# Patient Record
Sex: Female | Born: 1995 | Race: Black or African American | Hispanic: No | Marital: Single | Smoking: Former smoker
Health system: Southern US, Community
[De-identification: ages and names within clinical notes are randomized; demographics above are authoritative.]

## PROBLEM LIST (undated history)

## (undated) DIAGNOSIS — E049 Nontoxic goiter, unspecified: Secondary | ICD-10-CM

## (undated) DIAGNOSIS — E669 Obesity, unspecified: Secondary | ICD-10-CM

## (undated) HISTORY — DX: Nontoxic goiter, unspecified: E04.9

## (undated) HISTORY — DX: Obesity, unspecified: E66.9

---
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
## 2013-04-22 ENCOUNTER — Encounter (HOSPITAL_COMMUNITY): Payer: Self-pay | Admitting: Emergency Medicine

## 2013-04-22 ENCOUNTER — Emergency Department (HOSPITAL_COMMUNITY)
Admission: EM | Admit: 2013-04-22 | Discharge: 2013-04-22 | Disposition: A | Discharge status: Home / Self Care | Payer: BC Managed Care – PPO | Attending: Emergency Medicine | Admitting: Emergency Medicine

## 2013-04-22 DIAGNOSIS — Y92410 Unspecified street and highway as the place of occurrence of the external cause: Secondary | ICD-10-CM | POA: Insufficient documentation

## 2013-04-22 DIAGNOSIS — Z87891 Personal history of nicotine dependence: Secondary | ICD-10-CM | POA: Insufficient documentation

## 2013-04-22 DIAGNOSIS — S4980XA Other specified injuries of shoulder and upper arm, unspecified arm, initial encounter: Secondary | ICD-10-CM | POA: Insufficient documentation

## 2013-04-22 DIAGNOSIS — J45909 Unspecified asthma, uncomplicated: Secondary | ICD-10-CM | POA: Insufficient documentation

## 2013-04-22 DIAGNOSIS — S0993XA Unspecified injury of face, initial encounter: Secondary | ICD-10-CM | POA: Insufficient documentation

## 2013-04-22 DIAGNOSIS — Y9389 Activity, other specified: Secondary | ICD-10-CM | POA: Insufficient documentation

## 2013-04-22 DIAGNOSIS — Z91040 Latex allergy status: Secondary | ICD-10-CM | POA: Insufficient documentation

## 2013-04-22 DIAGNOSIS — S46909A Unspecified injury of unspecified muscle, fascia and tendon at shoulder and upper arm level, unspecified arm, initial encounter: Secondary | ICD-10-CM | POA: Insufficient documentation

## 2013-04-22 DIAGNOSIS — M542 Cervicalgia: Secondary | ICD-10-CM

## 2013-04-22 DIAGNOSIS — M549 Dorsalgia, unspecified: Secondary | ICD-10-CM

## 2013-04-22 DIAGNOSIS — E669 Obesity, unspecified: Secondary | ICD-10-CM | POA: Insufficient documentation

## 2013-04-22 DIAGNOSIS — S199XXA Unspecified injury of neck, initial encounter: Principal | ICD-10-CM

## 2013-04-22 DIAGNOSIS — IMO0002 Reserved for concepts with insufficient information to code with codable children: Secondary | ICD-10-CM | POA: Insufficient documentation

## 2013-04-22 DIAGNOSIS — Z79899 Other long term (current) drug therapy: Secondary | ICD-10-CM | POA: Insufficient documentation

## 2013-04-22 DIAGNOSIS — M79602 Pain in left arm: Secondary | ICD-10-CM

## 2013-04-22 MED ORDER — IBUPROFEN 600 MG PO TABS: 600.0000 mg | ORAL_TABLET | Freq: Four times a day (QID) | ORAL | Status: DC | PRN

## 2013-04-22 MED ORDER — CYCLOBENZAPRINE HCL 10 MG PO TABS
10.0000 mg | ORAL_TABLET | Freq: Once | ORAL | Status: AC
  Administered 2013-04-22: 10 mg via ORAL
  Filled 2013-04-22: qty 1

## 2013-04-22 MED ORDER — IBUPROFEN 400 MG PO TABS
800.0000 mg | ORAL_TABLET | Freq: Once | ORAL | Status: AC
  Administered 2013-04-22: 800 mg via ORAL
  Filled 2013-04-22: qty 2

## 2013-04-22 MED ORDER — CYCLOBENZAPRINE HCL 10 MG PO TABS: 10.0000 mg | ORAL_TABLET | Freq: Two times a day (BID) | ORAL | Status: DC | PRN
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
## 2013-04-22 NOTE — ED Notes (Signed)
PA at bedside.
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
## 2013-04-23 NOTE — ED Provider Notes (Signed)
Medical screening examination/treatment/procedure(s) were performed by non-physician practitioner and as supervising physician I was immediately available for consultation/collaboration.  EKG Interpretation   None        Kloi Brodman F Manish Ruggiero, MD 04/23/13 0943 

## 2013-07-04 ENCOUNTER — Other Ambulatory Visit: Payer: Self-pay | Admitting: Emergency Medicine

## 2013-07-11 ENCOUNTER — Encounter: Payer: Self-pay | Admitting: Family Medicine

## 2013-07-11 ENCOUNTER — Ambulatory Visit (INDEPENDENT_AMBULATORY_CARE_PROVIDER_SITE_OTHER): Payer: BC Managed Care – PPO | Admitting: Family Medicine

## 2013-07-11 VITALS — BP 116/71 | HR 83 | Temp 98.1°F | Resp 16 | Wt 199.0 lb

## 2013-07-11 DIAGNOSIS — J309 Allergic rhinitis, unspecified: Secondary | ICD-10-CM

## 2013-07-11 DIAGNOSIS — J029 Acute pharyngitis, unspecified: Secondary | ICD-10-CM

## 2013-07-11 MED ORDER — FLUTICASONE PROPIONATE 50 MCG/ACT NA SUSP: 2.0000 | Freq: Every day | NASAL | Status: DC

## 2013-07-11 NOTE — Progress Notes (Signed)
   Subjective:    Patient ID: Angelica GuilesJasmine D Shumard, female    DOB: 1995/11/30, 18 y.o.   MRN: [id number]  HPI 18 year old female presents with two-day history of nonproductive cough, rhinorrhea, nasal congestion, and sore throat. She has had associated chills however no fevers, no shortness of breath, no chest pain, no emesis, no abdominal pain, no ear fullness, no decreased hearing, no eye itching or drainage, denies sick contacts however is around a number of children, she has attempted throat lozenges which have provided some relief, she has not taken any over-the-counter cough and cold medications   Review of Systems Please see history of present illness    Objective:   Physical Exam Vitals: Reviewed General: Pleasant African American female, no acute distress HEENT: Normocephalic, pupils are equal round and reactive to light, extra amounts are intact, no scleral icterus, right TM was obscured by cerumen, left TM was pearly-gray, patient has erythematous inflamed bilateral nasal turbinates, mild rhinorrhea present, uvula midline, 3+ tonsils bilaterally with mild erythema, no exudate, moist mucous membranes, neck was supple, no anterior or posterior cervical lymphadenopathy Cardiac: Regular in rhythm, S1 and S2 present, no murmurs, no heaves or thrills Respiratory: Clear to patient bilaterally, normal effort Abdomen: Soft, nontender, bowel sounds present       Assessment & Plan:  Please see problem specific assessment and plan

## 2013-07-11 NOTE — Patient Instructions (Signed)
Viral Infections A virus is a type of germ. Viruses can cause:  Minor sore throats.  Aches and pains.  Headaches.  Runny nose.  Rashes.  Watery eyes.  Tiredness.  Coughs.  Loss of appetite.  Feeling sick to your stomach (nausea).  Throwing up (vomiting).  Watery poop (diarrhea). HOME CARE   Only take medicines as told by your doctor.  Drink enough water and fluids to keep your pee (urine) clear or pale yellow. Sports drinks are a good choice.  Get plenty of rest and eat healthy. Soups and broths with crackers or rice are fine. GET HELP RIGHT AWAY IF:   You have a very bad headache.  You have shortness of breath.  You have chest pain or neck pain.  You have an unusual rash.  You cannot stop throwing up.  You have watery poop that does not stop.  You cannot keep fluids down.  You or your child has a temperature by mouth above 102 F (38.9 C), not controlled by medicine.  Your baby is older than 3 months with a rectal temperature of 102 F (38.9 C) or higher.  Your baby is 413 months old or younger with a rectal temperature of 100.4 F (38 C) or higher. MAKE SURE YOU:   Understand these instructions.  Will watch this condition.  Will get help right away if you are not doing well or get worse. Document Released: 01/27/2008 Document Revised: 05/08/2011 Document Reviewed: 06/21/2010 [facility] Patient Information 2014 [place], [place].   You also have allergic rhinitis (runny nose/seasonal allergies) - start Flonase

## 2013-07-11 NOTE — Assessment & Plan Note (Signed)
Patient has signs and symptoms consistent with allergic rhinitis. -Initiate therapy with Flonase

## 2013-07-11 NOTE — Assessment & Plan Note (Signed)
Patient presents for evaluation of sore throat which is consistent with viral illness. -Conservative measures discussed including salt water gargles and throat lozenges

## 2014-03-09 ENCOUNTER — Encounter (HOSPITAL_COMMUNITY): Payer: Self-pay | Admitting: Emergency Medicine

## 2014-03-09 ENCOUNTER — Emergency Department (INDEPENDENT_AMBULATORY_CARE_PROVIDER_SITE_OTHER)
Admission: EM | Admit: 2014-03-09 | Discharge: 2014-03-09 | Disposition: A | Discharge status: Home / Self Care | Payer: BLUE CROSS/BLUE SHIELD | Source: Home / Self Care | Attending: Family Medicine | Admitting: Family Medicine

## 2014-03-09 DIAGNOSIS — L739 Follicular disorder, unspecified: Secondary | ICD-10-CM

## 2014-03-09 LAB — POCT PREGNANCY, URINE: Preg Test, Ur: NEGATIVE

## 2014-03-09 MED ORDER — MUPIROCIN 2 % EX OINT: 1.0000 "application " | TOPICAL_OINTMENT | Freq: Two times a day (BID) | CUTANEOUS | Status: DC

## 2014-03-09 MED ORDER — SULFAMETHOXAZOLE-TRIMETHOPRIM 800-160 MG PO TABS: 2.0000 | ORAL_TABLET | Freq: Two times a day (BID) | ORAL | Status: DC

## 2014-03-09 NOTE — ED Provider Notes (Signed)
Angelica Griffin is a 19 y.o. female who presents to Urgent Care today for rash. Patient developed a itchy painful rash under both axilla and on abdomen starting about a week ago. She's been using the same razor and thinks maybe that's causing it. No fevers or chills nausea or vomiting. No new soaps detergents or shampoos or medications. She's tried hydrocortisone which has not helped.   Past Medical History  Diagnosis Date  . Asthma   . Obesity    History reviewed. No pertinent past surgical history. History  Substance Use Topics  . Smoking status: Former Smoker    Quit date: 02/28/2011  . Smokeless tobacco: Not on file  . Alcohol Use: No   ROS as above Medications: No current facility-administered medications for this encounter.   Current Outpatient Prescriptions  Medication Sig Dispense Refill  . PROAIR HFA 108 (90 BASE) MCG/ACT inhaler INHALE 2 PUFFS INTO THE LUNGS EVERY 6 (SIX) HOURS AS NEEDED FOR WHEEZING. 8.5 each 0  . fluticasone (FLONASE) 50 MCG/ACT nasal spray Place 2 sprays into both nostrils daily. 16 g 6  . mupirocin ointment (BACTROBAN) 2 % Place 1 application into the nose 2 (two) times daily. 22 g 1  . sulfamethoxazole-trimethoprim (SEPTRA DS) 800-160 MG per tablet Take 2 tablets by mouth 2 (two) times daily. 28 tablet 0   Allergies  Allergen Reactions  . Latex Hives  . Chlorine Rash     Exam:  BP 134/65 mmHg  Pulse 74  Temp(Src) 98.5 F (36.9 C) (Oral)  Resp 16  SpO2 100% Gen: Well NAD Skin: Folliculitis bilateral axillary area and abdomen. Minimally tender.  Results for orders placed or performed during the hospital encounter of 03/09/14 (from the past 24 hour(s))  Pregnancy, urine POC     Status: None   Collection Time: 03/09/14  7:25 PM  Result Value Ref Range   Preg Test, Ur NEGATIVE NEGATIVE   No results found.  Assessment and Plan: 19 y.o. female with folliculitis. Treat with Bactrim mupirocin nasal ointment and chlorhexidine  wash  Discussed warning signs or symptoms. Please see discharge instructions. Patient expresses understanding.     Rodolph BongEvan S Corey, MD 03/09/14 [phone]

## 2014-03-09 NOTE — Discharge Instructions (Signed)
Thank you for coming in today. Take Bactrim twice daily. Use birth control a taking this medication. Use mupirocin ointment in the nose and over-the-counter chlorhexidine wash in the shower. Return as needed   Folliculitis  Folliculitis is redness, soreness, and swelling (inflammation) of the hair follicles. This condition can occur anywhere on the body. People with weakened immune systems, diabetes, or obesity have a greater risk of getting folliculitis. CAUSES  Bacterial infection. This is the most common cause.  Fungal infection.  Viral infection.  Contact with certain chemicals, especially oils and tars. Long-term folliculitis can result from bacteria that live in the nostrils. The bacteria may trigger multiple outbreaks of folliculitis over time. SYMPTOMS Folliculitis most commonly occurs on the scalp, thighs, legs, back, buttocks, and areas where hair is shaved frequently. An early sign of folliculitis is a small, white or yellow, pus-filled, itchy lesion (pustule). These lesions appear on a red, inflamed follicle. They are usually less than 0.2 inches (5 mm) wide. When there is an infection of the follicle that goes deeper, it becomes a boil or furuncle. A group of closely packed boils creates a larger lesion (carbuncle). Carbuncles tend to occur in hairy, sweaty areas of the body. DIAGNOSIS  Your caregiver can usually tell what is wrong by doing a physical exam. A sample may be taken from one of the lesions and tested in a lab. This can help determine what is causing your folliculitis. TREATMENT  Treatment may include:  Applying warm compresses to the affected areas.  Taking antibiotic medicines orally or applying them to the skin.  Draining the lesions if they contain a large amount of pus or fluid.  Laser hair removal for cases of long-lasting folliculitis. This helps to prevent regrowth of the hair. HOME CARE INSTRUCTIONS  Apply warm compresses to the affected areas as  directed by your caregiver.  If antibiotics are prescribed, take them as directed. Finish them even if you start to feel better.  You may take over-the-counter medicines to relieve itching.  Do not shave irritated skin.  Follow up with your caregiver as directed. SEEK IMMEDIATE MEDICAL CARE IF:   You have increasing redness, swelling, or pain in the affected area.  You have a fever. MAKE SURE YOU:  Understand these instructions.  Will watch your condition.  Will get help right away if you are not doing well or get worse. Document Released: 04/24/2001 Document Revised: 08/15/2011 Document Reviewed: 05/16/2011 [facility] Patient Information 2015 [place], [place]. This information is not intended to replace advice given to you by your health care provider. Make sure you discuss any questions you have with your health care provider.

## 2014-03-09 NOTE — ED Notes (Signed)
Pt states that she has a rash under both axilla and partly on abdomen.

## 2014-05-19 ENCOUNTER — Ambulatory Visit (INDEPENDENT_AMBULATORY_CARE_PROVIDER_SITE_OTHER): Payer: BLUE CROSS/BLUE SHIELD | Admitting: Family Medicine

## 2014-05-19 ENCOUNTER — Encounter: Payer: Self-pay | Admitting: Family Medicine

## 2014-05-19 VITALS — BP 115/80 | HR 80 | Temp 98.4°F | Ht 63.0 in | Wt 212.0 lb

## 2014-05-19 DIAGNOSIS — Z23 Encounter for immunization: Secondary | ICD-10-CM

## 2014-05-19 DIAGNOSIS — G44219 Episodic tension-type headache, not intractable: Secondary | ICD-10-CM

## 2014-05-19 NOTE — Progress Notes (Signed)
  Subjective:    Angelica Griffin is a 19 y.o. female who presents for evaluation of headache. Symptoms began about 5 days ago. Generally, the headaches last about 3 hours and occur intermittently . The headaches do not seem to be related to any time of the day. The headaches are usually dull and are located in temporal region B/L.  The patient rates her most severe headaches a 7 on a scale from 1 to 10. Recently, the headaches have been stable. Work attendance or other daily activities are not affected by the headaches. Precipitating factors include: none which have been determined. The headaches are usually not preceded by an aura. Associated neurologic symptoms: none. The patient denies decreased physical activity, loss of balance, muscle weakness, vision problems and vomiting in the early morning. Home treatment has included acetaminophen and ibuprofen with some improvement. Other history includes: nothing pertinent. Family history includes migraine headaches in mother.  The following portions of the patient's history were reviewed and updated as appropriate: allergies, current medications, past family history, past medical history, past social history, past surgical history and problem list.  Review of Systems Pertinent items are noted in HPI.    Objective:    BP 115/80 mmHg  Pulse 80  Temp(Src) 98.4 F (36.9 C) (Oral)  Ht 5\' 3"  (1.6 m)  Wt 212 lb (96.163 kg)  BMI 37.56 kg/m2 General appearance: alert, cooperative and appears stated age Heart: regular rate and rhythm, S1, S2 normal, no murmur, click, rub or gallop Neurologic: Alert and oriented X 3, normal strength and tone. Normal symmetric reflexes. Normal coordination and gait Cranial nerves: normal    Assessment:    Tension-type headache, episodic    Plan:    Lie in darkened room and apply cold packs as needed for pain. Side effect profile discussed in detail. Asked to keep headache diary. Most likely tension HA in  quality for this.  Does report Hx of migraines, but has never seen a neurologist.  She has body habitus for possible Pseudtumor Cerebri but BP well controlled on no medications and denies visual Sx.  if continues, would refer to neurology for possible prophylactic dosing and abortive medications for possible migraines.  no red flags today on exam

## 2014-05-19 NOTE — Patient Instructions (Signed)

## 2014-09-29 ENCOUNTER — Encounter: Payer: Self-pay | Admitting: Family Medicine

## 2014-09-29 ENCOUNTER — Ambulatory Visit (INDEPENDENT_AMBULATORY_CARE_PROVIDER_SITE_OTHER): Payer: Self-pay | Admitting: Family Medicine

## 2014-09-29 ENCOUNTER — Other Ambulatory Visit (HOSPITAL_COMMUNITY)
Admission: RE | Admit: 2014-09-29 | Discharge: 2014-09-29 | Disposition: A | Discharge status: Home / Self Care | Payer: Self-pay | Source: Ambulatory Visit | Attending: Family Medicine | Admitting: Family Medicine

## 2014-09-29 VITALS — BP 123/66 | HR 80 | Temp 98.4°F | Ht 63.0 in | Wt 212.9 lb

## 2014-09-29 DIAGNOSIS — Z01419 Encounter for gynecological examination (general) (routine) without abnormal findings: Secondary | ICD-10-CM | POA: Insufficient documentation

## 2014-09-29 DIAGNOSIS — N898 Other specified noninflammatory disorders of vagina: Secondary | ICD-10-CM

## 2014-09-29 DIAGNOSIS — N920 Excessive and frequent menstruation with regular cycle: Secondary | ICD-10-CM

## 2014-09-29 DIAGNOSIS — N760 Acute vaginitis: Secondary | ICD-10-CM | POA: Insufficient documentation

## 2014-09-29 DIAGNOSIS — Z124 Encounter for screening for malignant neoplasm of cervix: Secondary | ICD-10-CM

## 2014-09-29 DIAGNOSIS — Z113 Encounter for screening for infections with a predominantly sexual mode of transmission: Secondary | ICD-10-CM

## 2014-09-29 DIAGNOSIS — J029 Acute pharyngitis, unspecified: Secondary | ICD-10-CM

## 2014-09-29 LAB — HEPATITIS PANEL, ACUTE
HCV AB: NEGATIVE
HEP B C IGM: NONREACTIVE
HEP B S AG: NEGATIVE
Hep A IgM: NONREACTIVE

## 2014-09-29 LAB — POCT WET PREP (WET MOUNT): CLUE CELLS WET PREP WHIFF POC: POSITIVE

## 2014-09-29 LAB — POCT RAPID STREP A (OFFICE): RAPID STREP A SCREEN: POSITIVE — AB

## 2014-09-29 LAB — POCT URINE PREGNANCY: Preg Test, Ur: NEGATIVE

## 2014-09-29 LAB — HIV ANTIBODY (ROUTINE TESTING W REFLEX): HIV: NONREACTIVE

## 2014-09-29 MED ORDER — FLUCONAZOLE 150 MG PO TABS: ORAL_TABLET | ORAL | Status: DC

## 2014-09-29 MED ORDER — AMOXICILLIN 500 MG PO CAPS: 500.0000 mg | ORAL_CAPSULE | Freq: Two times a day (BID) | ORAL | Status: DC

## 2014-09-29 NOTE — Patient Instructions (Signed)
Testing for STD's. Also did first pap smear today. Will call you or send letter with results.  For strep - take amoxicillin 500mg  twice a day for 10 days Also sent in diflucan in case you get a yeast infection  If spotting persists please follow up with Dr. Sowell Dopp.  Be well, Dr. Pollie Meyer   Strep Throat Strep throat is an infection of the throat caused by a bacteria named Streptococcus pyogenes. Your health care provider may call the infection streptococcal "tonsillitis" or "pharyngitis" depending on whether there are signs of inflammation in the tonsils or back of the throat. Strep throat is most common in children aged 5-15 years during the cold months of the year, but it can occur in people of any age during any season. This infection is spread from person to person (contagious) through coughing, sneezing, or other close contact. SIGNS AND SYMPTOMS   Fever or chills.  Painful, swollen, red tonsils or throat.  Pain or difficulty when swallowing.  White or yellow spots on the tonsils or throat.  Swollen, tender lymph nodes or "glands" of the neck or under the jaw.  Red rash all over the body (rare). DIAGNOSIS  Many different infections can cause the same symptoms. A test must be done to confirm the diagnosis so the right treatment can be given. A "rapid strep test" can help your health care provider make the diagnosis in a few minutes. If this test is not available, a light swab of the infected area can be used for a throat culture test. If a throat culture test is done, results are usually available in a day or two. TREATMENT  Strep throat is treated with antibiotic medicine. HOME CARE INSTRUCTIONS   Gargle with 1 tsp of salt in 1 cup of warm water, 3-4 times per day or as needed for comfort.  Family members who also have a sore throat or fever should be tested for strep throat and treated with antibiotics if they have the strep infection.  Make sure everyone in your household  washes their hands well.  Do not share food, drinking cups, or personal items that could cause the infection to spread to others.  You may need to eat a soft food diet until your sore throat gets better.  Drink enough water and fluids to keep your urine clear or pale yellow. This will help prevent dehydration.  Get plenty of rest.  Stay home from school, day care, or work until you have been on antibiotics for 24 hours.  Take medicines only as directed by your health care provider.  Take your antibiotic medicine as directed by your health care provider. Finish it even if you start to feel better. SEEK MEDICAL CARE IF:   The glands in your neck continue to enlarge.  You develop a rash, cough, or earache.  You cough up green, yellow-brown, or bloody sputum.  You have pain or discomfort not controlled by medicines.  Your problems seem to be getting worse rather than better.  You have a fever. SEEK IMMEDIATE MEDICAL CARE IF:   You develop any new symptoms such as vomiting, severe headache, stiff or painful neck, chest pain, shortness of breath, or trouble swallowing.  You develop severe throat pain, drooling, or changes in your voice.  You develop swelling of the neck, or the skin on the neck becomes red and tender.  You develop signs of dehydration, such as fatigue, dry mouth, and decreased urination.  You become increasingly sleepy, or you  cannot wake up completely. MAKE SURE YOU:  Understand these instructions.  Will watch your condition.  Will get help right away if you are not doing well or get worse. Document Released: 02/11/2000 Document Revised: 06/30/2013 Document Reviewed: 04/14/2010 [facility] Patient Information 2015 [place], [place]. This information is not intended to replace advice given to you by your health care provider. Make sure you discuss any questions you have with your health care provider.

## 2014-09-29 NOTE — Progress Notes (Signed)
Patient ID: Angelica Griffin, female   DOB: 1995/04/08, 19 y.o.   MRN: [id number]  HPI:  Pt presents for a same day appointment to discuss:  Spotting: Began spotting one week ago, patient thinks this is now stopped. Patient is followed by Dr. Brekke Dopp of OB/GYN for amenorrhea. She does not usually have periods unless she takes a course of Provera, which is followed by withdrawal bleeding. She takes Provera for 5 days every 3 months, after which she has a period. She's been doing this regimen for about 5 years. Last took her Provera 1-1/2 months ago. Denies any discharge or pelvic pain. Sexually active with one female partner in the last year. Has not taken a pregnancy test. Does not always use condoms. Would like STD testing.  Sore throat: Has had sore throat since Saturday. Associated with mild cough. No runny nose. Notes a tender area to the left side of her neck. Eating and drinking well. She is around children a lot. No known fever.  ROS: See HPI  PMFSH: Asthma, allergic rhinitis, goiter, obesity  PHYSICAL EXAM: BP 123/66 mmHg  Pulse 80  Temp(Src) 98.4 F (36.9 C) (Oral)  Ht  (1.6 m)  Wt 212 lb 14.4 oz (96.571 kg)  BMI 37.72 kg/m2 Gen: NAD, pleasant, cooperative HEENT: NCAT. Tender anterior cervical lymphadenopathy. Oropharynx erythematous without exudate. R TM with cerumen impaction preventing visualizing TM. L TM normal. Nares patent. Heart: RRR no murmur Lungs: CTAB NWOB Neuro: grossly nonfocal speech normal GU: normal appearing external genitalia without lesions. Vagina is moist with normal appearing discharge. Cervix normal in appearance. No cervical motion tenderness or tenderness on bimanual exam. No adnexal masses.   ASSESSMENT/PLAN:  1. Vaginal spotting: Etiology unclear, possibly related to abnormal bleeding at baseline, requiring Provera in order to have a period. Urine pregnancy test negative today. No abnormalities noted on pelvic exam. Spotting has fortunately  stopped on its own. Plan: - first pap smear done today as patient is now 73, and we were already doing pelvic exam - Will send full STD tests today (gc/chlamydia/trich, wet prep, HIV, RPR, hepatitis panel). I will call pt with results (phone # [phone]) - counseled pt to f/u with OB/GYN Dr. Jansma Dopp if she has recurrence of the spotting, as she manages pt's hormonal therapy for amenorrhea.  2. Sore throat: 2 of 4 centor criteria present. Rapid strep positive. Treat with amoxicillin  BID x 10 days. Given rx for diflucan in case develops yeast infection. F/u if not improving.  FOLLOW UP: F/u as needed if symptoms worsen or do not improve.   [place] J. Pollie Meyer, MD [facility]

## 2014-09-30 LAB — RPR

## 2014-10-01 LAB — CYTOLOGY - PAP

## 2014-10-02 ENCOUNTER — Telehealth: Payer: Self-pay | Admitting: Family Medicine

## 2014-10-02 MED ORDER — METRONIDAZOLE 500 MG PO TABS: 500.0000 mg | ORAL_TABLET | Freq: Two times a day (BID) | ORAL | Status: DC

## 2014-10-02 NOTE — Telephone Encounter (Signed)
Called pt to report negative STD results. Wet prep did show BV. Pap normal. Pt would like to be treated with flagyl. Will send in rx to her pharmacy. Pt appreciative.  Latrelle Dodrill, MD

## 2014-11-10 ENCOUNTER — Encounter: Payer: Self-pay | Admitting: Family Medicine

## 2014-11-10 ENCOUNTER — Ambulatory Visit (INDEPENDENT_AMBULATORY_CARE_PROVIDER_SITE_OTHER): Payer: Self-pay | Admitting: Family Medicine

## 2014-11-10 VITALS — BP 121/63 | HR 76 | Temp 98.2°F | Resp 16 | Ht 63.0 in | Wt 206.0 lb

## 2014-11-10 DIAGNOSIS — T7840XA Allergy, unspecified, initial encounter: Secondary | ICD-10-CM

## 2014-11-10 MED ORDER — HYDROXYZINE HCL 10 MG PO TABS: 10.0000 mg | ORAL_TABLET | Freq: Three times a day (TID) | ORAL | Status: DC | PRN

## 2014-11-10 MED ORDER — EPINEPHRINE 0.3 MG/0.3ML IJ SOAJ: 0.3000 mg | Freq: Once | INTRAMUSCULAR | Status: DC

## 2014-11-10 MED ORDER — ALBUTEROL SULFATE HFA 108 (90 BASE) MCG/ACT IN AERS: INHALATION_SPRAY | RESPIRATORY_TRACT | Status: DC

## 2014-11-10 NOTE — Progress Notes (Signed)
Patient ID: Angelica Griffin, female   DOB: 04-06-1995, 19 y.o.   MRN: [id number]   [facility] Yolande Jolly, MD Phone: [phone]  Subjective:   # Allergic Reaction - Pt. Here after developing allergic / itching symptoms on her hands after placing them into gloves with powder in them, that she thinks she may be allergic to.  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  - She says she was using nitrile gloves, but that there was a powder in them that she thinks causes her symptoms.  - She says that she has had a near anaphylactic reaction to latex before.  - She says she does not have any throat itching, lip swelling, tongue swelling, tingling around her mouth or in her throat, eye swelling, nausea, diarrhea, or hives at this time.  - her main complaint is itchy bumps on her hands where she was exposed to the poweder.  - She has asthma, that is controlled with albuterol.  - She otherwise has not taken any new medications or applied any new lotions.   All relevant systems were reviewed and were negative unless otherwise noted in the HPI  Past Medical History Reviewed problem list.  Medications- reviewed and updated Current Outpatient Prescriptions  Medication Sig Dispense Refill  . albuterol (PROAIR HFA) 108 (90 BASE) MCG/ACT inhaler INHALE 2 PUFFS INTO THE LUNGS EVERY 6 (SIX) HOURS AS NEEDED FOR WHEEZING. 8.5 each 0  . medroxyPROGESTERone (DEPO-PROVERA) 150 MG/ML injection Inject 150 mg into the muscle every 3 (three) months.    Marland Kitchen amoxicillin (AMOXIL) 500 MG capsule Take 1 capsule (500 mg total) by mouth 2 (two) times daily. For 10 days (Patient not taking: Reported on 11/10/2014) 20 capsule 0  . EPINEPHrine 0.3 mg/0.3 mL IJ SOAJ injection Inject 0.3 mLs (0.3 mg total) into the muscle once. 1 Device 2  . fluconazole (DIFLUCAN) 150 MG tablet Take one pill once if develop yeast infection. Repeat in 3 days if not  better. (Patient not taking: Reported on 11/10/2014) 2 tablet 0  . hydrOXYzine (ATARAX/VISTARIL) 10 MG tablet Take 1 tablet (10 mg total) by mouth 3 (three) times daily as needed. 30 tablet 2  . metroNIDAZOLE (FLAGYL) 500 MG tablet Take 1 tablet (500 mg total) by mouth 2 (two) times daily. For 7 days. Do not drink alcohol while taking this medication. (Patient not taking: Reported on 11/10/2014) 14 tablet 0   No current facility-administered medications for this visit.   Chief complaint-noted No additions to family history Social history- patient is a non smoker  Objective: BP 121/63 mmHg  Pulse 76  Temp(Src) 98.2 F (36.8 C) (Oral)  Resp 16  Ht 5\' 3"  (1.6 m)  Wt 206 lb (93.441 kg)  BMI 36.50 kg/m2 Gen: NAD, alert, cooperative with exam HEENT: NCAT, EOMI, PERRL, O/P clear without swelling or erythema.  Neck: FROM, supple CV: RRR, good S1/S2, no murmur Resp: CTABL, no wheezes, non-labored Abd: SNTND, BS present, no guarding or organomegaly Ext: No edema, warm, normal tone, moves UE/LE spontaneously Neuro: Alert and oriented, No gross deficits Skin: small macular rash on her hands, but otherwise no urticaria, no signs of angioedema, lip swelling, or other red flag signs.   Assessment/Plan:  # Allergic Dermatitis. - Pt. With mild symptoms at this time. She says that hydroxyzine has worked for her in the past. She does not have an epi pen despite being allergic to several  different fruits, latex, and chlorine.  - Hydroxyzine for itching until reaction subsides.  - No steroids at this time, as her symptoms are not severe.  - Return precautions reviewed.  - Epi pen prescribed given her allergic history in case she were to need it.  - Follow up with pcp prn.

## 2014-11-10 NOTE — Patient Instructions (Signed)
Thanks for coming in today.   You likely have an allergic reaction to something in the gloves that you were putting on.   Take the Hydroxyzine as needed for your symptoms. If you develop worsening of your symptoms, or airway symptoms, nausea, or diarrhea. Return for evaluation as you may need a course of steroids.   Make a follow up appointment with Dr. Beryle Flock to discuss the depo-provera.   Thanks for letting us take care of you.   Sincerely,  Devota Pace, MD Family Medicine - PGY 2

## 2014-11-10 NOTE — Progress Notes (Signed)
C/C Allergy reaction to powder gloves  Rash on hand and face

## 2015-03-30 ENCOUNTER — Ambulatory Visit (INDEPENDENT_AMBULATORY_CARE_PROVIDER_SITE_OTHER): Payer: Self-pay | Admitting: Family Medicine

## 2015-03-30 ENCOUNTER — Encounter: Payer: Self-pay | Admitting: Family Medicine

## 2015-03-30 ENCOUNTER — Other Ambulatory Visit (HOSPITAL_COMMUNITY)
Admission: RE | Admit: 2015-03-30 | Discharge: 2015-03-30 | Disposition: A | Discharge status: Home / Self Care | Payer: BC Managed Care – PPO | Source: Ambulatory Visit | Attending: Family Medicine | Admitting: Family Medicine

## 2015-03-30 VITALS — BP 119/72 | HR 71 | Temp 98.4°F | Wt 213.1 lb

## 2015-03-30 DIAGNOSIS — A599 Trichomoniasis, unspecified: Secondary | ICD-10-CM

## 2015-03-30 DIAGNOSIS — N760 Acute vaginitis: Secondary | ICD-10-CM | POA: Diagnosis present

## 2015-03-30 DIAGNOSIS — N898 Other specified noninflammatory disorders of vagina: Secondary | ICD-10-CM

## 2015-03-30 DIAGNOSIS — Z113 Encounter for screening for infections with a predominantly sexual mode of transmission: Secondary | ICD-10-CM | POA: Diagnosis present

## 2015-03-30 LAB — POCT WET PREP (WET MOUNT): Clue Cells Wet Prep Whiff POC: POSITIVE

## 2015-03-30 MED ORDER — METRONIDAZOLE 500 MG PO TABS
2000.0000 mg | ORAL_TABLET | Freq: Once | ORAL | Status: AC
  Administered 2015-03-30: 2000 mg via ORAL

## 2015-03-30 NOTE — Addendum Note (Signed)
Addended by: Georges Lynch T on: 03/30/2015 02:29 PM   Modules accepted: Orders

## 2015-03-30 NOTE — Progress Notes (Signed)
Date of Visit: 03/30/2015   HPI:  Patient presents to discuss vaginal discharge.  Has had this for 1-2 months. Denies pelvic pain. Discharge is white and thin. Tried monistat in December without any relief. Has had some vulvar itching. No pelvic pain. No vaginal bleeding. Not worried about having STD's but is agreeable to testing today.   Previously had to take provera q41mo in order to induce periods, but since October has had regularly monthly periods. The first one in Oct lasted appx 2 weeks, since then has had monthly 4-5 day periods. Last period started 03/12/15. Last sexually active in October.  ROS: See HPI.  PMFSH: history of asthma, allergic rhinitis, obesity  PHYSICAL EXAM: BP 119/72 mmHg  Pulse 71  Temp(Src) 98.4 F (36.9 C) (Oral)  Wt 213 lb 1.6 oz (96.662 kg) Gen: NAD, pleasant, cooperative GU: normal appearing external genitalia without lesions. Vagina is moist with frothy white thin discharge. Strawberry cervix present. No cervical motion tenderness or tenderness on bimanual exam. No adnexal masses.   ASSESSMENT/PLAN:  Vaginal discharge:  Exam concerning for trichomonas infection with frothy white thing discharge and strawberry cervix. Wet prep confirms this diagnosis.  - Will treat with 2g flagyl PO x1 here in clinic.  - Given handout and counseled on informing partner, and abstaining from sex for 1 week until after treatment - Also send gc/chlamydia, HIV, RPR. Follow up if not improving.  FOLLOW UP: Follow up as needed if symptoms worsen or fail to improve.    [place] J. Pollie Meyer, MD [facility]

## 2015-03-30 NOTE — Patient Instructions (Signed)
Checking for STD's I will call you with results  Follow up as needed  Be well, Dr. Pollie Meyer

## 2015-03-31 LAB — CERVICOVAGINAL ANCILLARY ONLY
Chlamydia: NEGATIVE
Neisseria Gonorrhea: NEGATIVE
TRICH (WINDOWPATH): POSITIVE — AB

## 2015-03-31 LAB — RPR

## 2015-03-31 LAB — HIV ANTIBODY (ROUTINE TESTING W REFLEX): HIV: NONREACTIVE

## 2016-08-02 ENCOUNTER — Ambulatory Visit (INDEPENDENT_AMBULATORY_CARE_PROVIDER_SITE_OTHER): Payer: Self-pay | Admitting: Student

## 2016-08-02 ENCOUNTER — Encounter: Payer: Self-pay | Admitting: Student

## 2016-08-02 DIAGNOSIS — S8992XA Unspecified injury of left lower leg, initial encounter: Secondary | ICD-10-CM

## 2016-08-02 DIAGNOSIS — S8990XA Unspecified injury of unspecified lower leg, initial encounter: Secondary | ICD-10-CM | POA: Insufficient documentation

## 2016-08-02 NOTE — Assessment & Plan Note (Signed)
Mild knee bruising after a fall, no pain swelling weakness or reduced ROM - will follow as needed - cleared for return to work with full duties

## 2016-08-02 NOTE — Progress Notes (Signed)
   Subjective:    Patient ID: Angelica Griffin, female    DOB: 03-15-1995, 21 y.o.   MRN: [id number]   CC: fell on left knee  HPI: 21 y/o F presents after fall on left knee  Fall on left knee - she stripped on garden equipment 4 days ago and fell on her knee - she has been able to walk without difficulty, no weakness - she wore a brace to work yesterday which made her limp and because of her limp her job requested that she be seen by a doctor - no swelling - just bruise when she fell - no pain   Smoking status reviewed  Review of Systems  Per HPI, else denies recent illness, fever, chest pain, shortness of breath,     Objective:  BP 104/70   Pulse 67   Temp 98 F (36.7 C) (Oral)   Wt 215 lb (97.5 kg)   SpO2 99%   BMI 38.09 kg/m  Vitals and nursing note reviewed  General: NAD Cardiac: RRR,  Respiratory: CTAB, normal effort MSK: two bruises approx 2-3 cm in diameter on medial left knee, no joint tebderness, no swelling, normal ROM, normal gait Skin: warm and dry, no rashes noted Neuro: alert and oriented, no focal deficits   Assessment & Plan:    Knee injury Mild knee bruising after a fall, no pain swelling weakness or reduced ROM - will follow as needed - cleared for return to work with full duties    Janeli Lewison A. Kennon RoundsHaney MD, MS Family Medicine Resident PGY-3 Pager [phone]

## 2016-08-02 NOTE — Patient Instructions (Signed)
Follow up as needed Call the office with questions or concerns 

## 2017-01-21 ENCOUNTER — Emergency Department (HOSPITAL_COMMUNITY)
Admission: EM | Admit: 2017-01-21 | Discharge: 2017-01-21 | Disposition: A | Discharge status: Home / Self Care | Payer: BC Managed Care – PPO | Attending: Emergency Medicine | Admitting: Emergency Medicine

## 2017-01-21 ENCOUNTER — Encounter (HOSPITAL_COMMUNITY): Payer: Self-pay | Admitting: *Deleted

## 2017-01-21 ENCOUNTER — Other Ambulatory Visit: Payer: Self-pay

## 2017-01-21 DIAGNOSIS — Z79899 Other long term (current) drug therapy: Secondary | ICD-10-CM | POA: Insufficient documentation

## 2017-01-21 DIAGNOSIS — Z87891 Personal history of nicotine dependence: Secondary | ICD-10-CM | POA: Insufficient documentation

## 2017-01-21 DIAGNOSIS — R55 Syncope and collapse: Secondary | ICD-10-CM | POA: Insufficient documentation

## 2017-01-21 LAB — BASIC METABOLIC PANEL
Anion gap: 8 (ref 5–15)
BUN: 8 mg/dL (ref 6–20)
CHLORIDE: 105 mmol/L (ref 101–111)
CO2: 24 mmol/L (ref 22–32)
CREATININE: 0.83 mg/dL (ref 0.44–1.00)
Calcium: 8.9 mg/dL (ref 8.9–10.3)
GFR calc non Af Amer: >60 mL/min (ref >60)
Glucose, Bld: 98 mg/dL (ref 65–99)
Potassium: 3.8 mmol/L (ref 3.5–5.1)
Sodium: 137 mmol/L (ref 135–145)

## 2017-01-21 LAB — CBC
HCT: 42.9 % (ref 36.0–46.0)
Hemoglobin: 14.3 g/dL (ref 12.0–15.0)
MCH: 28.7 pg (ref 26.0–34.0)
MCHC: 33.3 g/dL (ref 30.0–36.0)
MCV: 86.1 fL (ref 78.0–100.0)
Platelets: 279 10*3/uL (ref 150–400)
RBC: 4.98 MIL/uL (ref 3.87–5.11)
RDW: 12.3 % (ref 11.5–15.5)
WBC: 6.3 10*3/uL (ref 4.0–10.5)

## 2017-01-21 LAB — URINALYSIS, ROUTINE W REFLEX MICROSCOPIC
Bilirubin Urine: NEGATIVE
GLUCOSE, UA: NEGATIVE mg/dL
HGB URINE DIPSTICK: NEGATIVE
KETONES UR: NEGATIVE mg/dL
LEUKOCYTES UA: NEGATIVE
Nitrite: NEGATIVE
PH: 7 (ref 5.0–8.0)
Protein, ur: NEGATIVE mg/dL
Specific Gravity, Urine: 1.011 (ref 1.005–1.030)

## 2017-01-21 LAB — I-STAT BETA HCG BLOOD, ED (MC, WL, AP ONLY): I-stat hCG, quantitative: <5 m[IU]/mL (ref <5)

## 2017-01-21 MED ORDER — SODIUM CHLORIDE 0.9 % IV BOLUS (SEPSIS)
1000.0000 mL | Freq: Once | INTRAVENOUS | Status: AC
  Administered 2017-01-21: 1000 mL via INTRAVENOUS

## 2017-01-21 MED ORDER — IBUPROFEN 800 MG PO TABS
800.0000 mg | ORAL_TABLET | Freq: Once | ORAL | Status: AC
  Administered 2017-01-21: 800 mg via ORAL
  Filled 2017-01-21: qty 1

## 2017-01-21 NOTE — ED Triage Notes (Signed)
Pt reports possible syncopal episode last night. Pt woke up on the floor and now has headache and mouth pain. Denies feeling bad yesterday prior to syncopal episode.

## 2017-01-21 NOTE — ED Provider Notes (Signed)
[facility]   CSN: [id number] Arrival date & time: 01/21/17  1217     History   Chief Complaint Chief Complaint  Patient presents with  . Loss of Consciousness    HPI Angelica Griffin is a 21 y.o. female.  HPI Patient presents to the emergency department with syncope that occurred last night.  The patient states that she was asleep and was startled awake but someone knocking on her door and she jumped up quickly and started feeling lightheaded and then when she went to close the door she states she passed out.  Patient states she did hit her head.  Patient states that nothing else seemed to exacerbate the problem.  The patient denies chest pain, shortness of breath, headache,blurred vision, neck pain, fever, cough, weakness, numbness, dizziness, anorexia, edema, abdominal pain, nausea, vomiting, diarrhea, rash, back pain, dysuria, hematemesis, bloody stool.  Past Medical History:  Diagnosis Date  . Asthma   . Obesity     Patient Active Problem List   Diagnosis Date Noted  . Knee injury 08/02/2016  . Allergic rhinitis 07/11/2013  . Allergic reaction 05/23/2012  . ABSENCE OF MENSTRUATION 11/29/2009  . ASTHMA, INTERMITTENT 10/30/2007  . GOITER NOS 04/26/2006  . OBESITY, NOS 04/26/2006    History reviewed. No pertinent surgical history.  OB History    No data available       Home Medications    Prior to Admission medications   Medication Sig Start Date End Date Taking? Authorizing Provider  albuterol (PROAIR HFA) 108 (90 BASE) MCG/ACT inhaler INHALE 2 PUFFS INTO THE LUNGS EVERY 6 (SIX) HOURS AS NEEDED FOR WHEEZING. 11/10/14  Yes Melancon, Hillery Hunteraleb G, MD  amoxicillin (AMOXIL) 500 MG capsule Take 1 capsule (500 mg total) by mouth 2 (two) times daily. For 10 days Patient not taking: Reported on 11/10/2014 09/29/14   Latrelle DodrillMcIntyre, Brittany J, MD  EPINEPHrine 0.3 mg/0.3 mL IJ SOAJ injection Inject 0.3 mLs (0.3 mg total) into  the muscle once. Patient not taking: Reported on 01/21/2017 11/10/14   Melancon, Hillery Hunteraleb G, MD  fluconazole (DIFLUCAN) 150 MG tablet Take one pill once if develop yeast infection. Repeat in 3 days if not better. Patient not taking: Reported on 11/10/2014 09/29/14   Latrelle DodrillMcIntyre, Brittany J, MD  hydrOXYzine (ATARAX/VISTARIL) 10 MG tablet Take 1 tablet (10 mg total) by mouth 3 (three) times daily as needed. Patient not taking: Reported on 01/21/2017 11/10/14   Melancon, Hillery Hunteraleb G, MD  metroNIDAZOLE (FLAGYL) 500 MG tablet Take 1 tablet (500 mg total) by mouth 2 (two) times daily. For 7 days. Do not drink alcohol while taking this medication. Patient not taking: Reported on 11/10/2014 10/02/14   Latrelle DodrillMcIntyre, Brittany J, MD    Family History Family History  Problem Relation Age of Onset  . Diabetes Maternal Grandmother   . Hypertension Maternal Grandmother     Social History Social History   Tobacco Use  . Smoking status: Former Smoker    Last attempt to quit: 02/28/2011    Years since quitting: 5.9  . Smokeless tobacco: Never Used  Substance Use Topics  . Alcohol use: No  . Drug use: No     Allergies   Latex and Chlorine   Review of Systems Review of Systems  All other systems negative except as documented in the HPI. All pertinent positives and negatives as reviewed in the HPI. Physical Exam Updated Vital Signs BP 123/76   Pulse 66   Temp 98.4  F (36.9 C) (Oral)   Resp 18   Ht 5\' 3"  (1.6 m)   Wt 100.2 kg (221 lb)   LMP 01/06/2017   SpO2 100%   BMI 39.15 kg/m   Physical Exam  Constitutional: She is oriented to person, place, and time. She appears well-developed and well-nourished. No distress.  HENT:  Head: Normocephalic and atraumatic.  Mouth/Throat: Oropharynx is clear and moist.  Eyes: Pupils are equal, round, and reactive to light.  Neck: Normal range of motion. Neck supple.  Cardiovascular: Normal rate, regular rhythm and normal heart sounds. Exam reveals no gallop and no  friction rub.  No murmur heard. Pulmonary/Chest: Effort normal and breath sounds normal. No respiratory distress. She has no wheezes.  Abdominal: Soft. Bowel sounds are normal. She exhibits no distension. There is no tenderness.  Neurological: She is alert and oriented to person, place, and time. She exhibits normal muscle tone. Coordination normal.  Skin: Skin is warm and dry. Capillary refill takes less than 2 seconds. No rash noted. No erythema.  Psychiatric: She has a normal mood and affect. Her behavior is normal.  Nursing note and vitals reviewed.    ED Treatments / Results  Labs (all labs ordered are listed, but only abnormal results are displayed) Labs Reviewed  URINALYSIS, ROUTINE W REFLEX MICROSCOPIC - Abnormal; Notable for the following components:      Result Value   Color, Urine STRAW (*)    All other components within normal limits  BASIC METABOLIC PANEL  CBC  I-STAT BETA HCG BLOOD, ED ([facility], [facility], [facility] ONLY)    EKG  EKG Interpretation  Date/Time:  Sunday January 21 2017 12:44:25 EST Ventricular Rate:  79 PR Interval:    QRS Duration: 87 QT Interval:  360 QTC Calculation: 413 R Axis:   62 Text Interpretation:  Sinus rhythm ST elev, probable normal early repol pattern Baseline wander in lead(s) V6 No old tracing to compare Confirmed by Mancel BaleWentz, Elliott [phone]([id number]) on 01/21/2017 2:50:11 PM       Radiology No results found.  Procedures Procedures (including critical care time)  Medications Ordered in ED Medications  sodium chloride 0.9 % bolus 1,000 mL (0 mLs Intravenous Stopped 01/21/17 1432)  ibuprofen (ADVIL,MOTRIN) tablet 800 mg (800 mg Oral Given 01/21/17 1432)     Initial Impression / Assessment and Plan / ED Course  I have reviewed the triage vital signs and the nursing notes.  Pertinent labs & imaging results that were available during my care of the patient were reviewed by me and considered in my medical decision making (see chart for details).      Patient was given IV fluids and has been ambulate without difficulty.  Told to return here as needed patient agrees the plan and all questions were answered.  Feel that the patient's syncope was due to the fact that she was startled stood up quickly.  Final Clinical Impressions(s) / ED Diagnoses   Final diagnoses:  None    ED Discharge Orders    None       Charlestine NightLawyer, Jhanvi Drakeford, PA-C 01/21/17 1545    Mancel BaleWentz, Elliott, MD 01/21/17 [phone]

## 2017-01-21 NOTE — Discharge Instructions (Signed)
Return here as needed.  Take Tylenol and Motrin for your headache.  Increase your fluid intake.

## 2018-02-08 ENCOUNTER — Other Ambulatory Visit: Payer: Self-pay

## 2018-02-08 ENCOUNTER — Ambulatory Visit (INDEPENDENT_AMBULATORY_CARE_PROVIDER_SITE_OTHER): Payer: No Typology Code available for payment source | Admitting: Family Medicine

## 2018-02-08 VITALS — BP 110/72 | HR 88 | Temp 99.0°F | Ht 63.0 in | Wt 230.4 lb

## 2018-02-08 DIAGNOSIS — Z124 Encounter for screening for malignant neoplasm of cervix: Secondary | ICD-10-CM | POA: Insufficient documentation

## 2018-02-08 DIAGNOSIS — R6889 Other general symptoms and signs: Secondary | ICD-10-CM | POA: Diagnosis not present

## 2018-02-08 DIAGNOSIS — F17200 Nicotine dependence, unspecified, uncomplicated: Secondary | ICD-10-CM | POA: Diagnosis not present

## 2018-02-08 DIAGNOSIS — Z23 Encounter for immunization: Secondary | ICD-10-CM | POA: Diagnosis not present

## 2018-02-08 DIAGNOSIS — Z0000 Encounter for general adult medical examination without abnormal findings: Secondary | ICD-10-CM | POA: Diagnosis not present

## 2018-02-08 NOTE — Assessment & Plan Note (Signed)
Patient behind on tetanus shot, no recent injuries.  Tdap ordered

## 2018-02-08 NOTE — Progress Notes (Signed)
Subjective:  Angelica Griffin is a 22 y.o. female who [facility] today with a chief complaint of flulike symptoms.   HPI: Patient is a 22 year old female with no significant comorbidities who presents complaining of flulike symptoms since Tuesday which is 2 days ago.  She states she has had subjective fevers (he has not checked her temperature), decrease in appetite but no nausea or vomiting, minor muscle aches but no paralysis or changes in sensation, no urinary or bowel changes, no ear pain, minor rhinorrhea but no sinus pressure, no significant cough or sore throat, no sensation of throat swelling.  She has missed 2 days of work but at no point felt that she was sick enough to require hospital visit.  She would like to know if she is okay to get a flu shot and when she go back to work.  We discussed that her tetanus shot is overdue and she consents to getting one today, she denies any recent injuries  We discussed that she is overdue for a Pap smear.  She denies any vaginal or urinary symptoms at this time and does not have any memory of getting a Pap smear outside of [facility].  We discussed the importance of regular screening in relation to cervical cancer  Patient smokes tobacco regularly, she uses it particular when she is stressed which she has at this time because her sister is living with her with causing stress at home.  She says she is thinking about quitting but is not ready yet   Objective:  Physical Exam: BP 110/72   Pulse 88   Temp 99 F (37.2 C) (Oral)   Ht 5\' 3"  (1.6 m)   Wt 230 lb 6.4 oz (104.5 kg)   LMP 02/03/2018 (Exact Date)   SpO2 97%   BMI 40.81 kg/m   Gen: NAD, resting comfortably ENT: no lymphadenopathy CV: RRR with no murmurs appreciated Pulm: NWOB, CTAB with no crackles, wheezes, or rhonchi GI: Normal bowel sounds present. Soft, Nontender, Nondistended. MSK: no edema, cyanosis, or clubbing noted Skin: warm, dry Neuro: grossly normal,  moves all extremities Psych: Normal affect and thought content  No results found for this or any previous visit (from the past 72 hour(s)).   Assessment/Plan:  Need for immunization against influenza Patient with only minor symptoms, shared decision-making results in flu shot given this visit  Healthcare maintenance We had lengthy discussion about risks of falling behind on cervical cancer screening.  Patient will schedule next available office visit to get Pap smear soon  Vaccine for diphtheria-tetanus-pertussis, combined Patient behind on tetanus shot, no recent injuries.  Tdap ordered  Tobacco use disorder Patient still smoking.  She is at the stage of thinking about quitting, but wants to wait until her sister is moved out because it causes stress and she uses cigarettes as a stress reliever.  We discussed medicinal approaches to stress and for tobacco cessation but she declines at this time.  Flu-like symptoms Patient is afebrile, with only minor symptoms of muscle ache-slight decrease in appetite-subjective fevers.  Symptoms of already been improving since starting on Tuesday with no medication.  Did not test for flu as this would not change treatment plan.  Shared decision making for flu shot.  Discussed limitations of Tamiflu and decided not to offer that.  Can use over-the-counter symptom control.  Patient may return to work when she feels she is symptomatically ready and work note is given.   Marthenia RollingScott Nastassja Witkop,  DO FAMILY MEDICINE RESIDENT - PGY2 02/08/2018 10:56 AM

## 2018-02-08 NOTE — Patient Instructions (Signed)
It was a pleasure to see you today! Thank you for choosing [facility] for your primary care. Angelica Griffin was seen for flu symptoms. Come back to the clinic to see Dr. Artist PaisYoo for your pap smear, and go to the emergency room if you have any life-threatening symptoms.   Today we discussed your flulike symptoms.  It seems that the symptoms are consistent with a viral illness.  As they seem to be resolving and are relatively minor in nature we did not do a flu swab.  We are offering you a flu vaccination shot and you can treat your symptoms with over-the-counter medicine as discussed.  Ibuprofen or Tylenol for muscle aches, and over-the-counter cold medicine.  We are also offering you a tetanus shot because you seem to be out of date.  We are also suggesting that you schedule with Dr. Artist PaisYoo for a Pap smear because that seems to be out of date as well.   Please bring all your medications to every doctors visit   Sign up for My Chart to have easy access to your labs results, and communication with your Primary care physician.     Please check-out at the front desk before leaving the clinic.     Best,  Dr. Marthenia RollingScott Mackynzie Woolford FAMILY MEDICINE RESIDENT - PGY2 02/08/2018 9:27 AM

## 2018-02-08 NOTE — Assessment & Plan Note (Signed)
We had lengthy discussion about risks of falling behind on cervical cancer screening.  Patient will schedule next available office visit to get Pap smear soon

## 2018-02-08 NOTE — Assessment & Plan Note (Signed)
Patient with only minor symptoms, shared decision-making results in flu shot given this visit

## 2018-02-08 NOTE — Assessment & Plan Note (Signed)
Patient still smoking.  She is at the stage of thinking about quitting, but wants to wait until her sister is moved out because it causes stress and she uses cigarettes as a stress reliever.  We discussed medicinal approaches to stress and for tobacco cessation but she declines at this time.

## 2018-02-08 NOTE — Assessment & Plan Note (Signed)
Patient is afebrile, with only minor symptoms of muscle ache-slight decrease in appetite-subjective fevers.  Symptoms of already been improving since starting on Tuesday with no medication.  Did not test for flu as this would not change treatment plan.  Shared decision making for flu shot.  Discussed limitations of Tamiflu and decided not to offer that.  Can use over-the-counter symptom control.  Patient may return to work when she feels she is symptomatically ready and work note is given.

## 2018-02-18 ENCOUNTER — Encounter: Payer: Self-pay | Admitting: Family Medicine

## 2018-02-18 ENCOUNTER — Ambulatory Visit (INDEPENDENT_AMBULATORY_CARE_PROVIDER_SITE_OTHER): Payer: No Typology Code available for payment source | Admitting: Family Medicine

## 2018-02-18 ENCOUNTER — Other Ambulatory Visit (HOSPITAL_COMMUNITY)
Admission: RE | Admit: 2018-02-18 | Discharge: 2018-02-18 | Disposition: A | Discharge status: Home / Self Care | Payer: No Typology Code available for payment source | Source: Ambulatory Visit | Attending: Family Medicine | Admitting: Family Medicine

## 2018-02-18 VITALS — BP 106/58 | HR 79 | Temp 98.2°F | Ht 63.0 in | Wt 230.1 lb

## 2018-02-18 DIAGNOSIS — N979 Female infertility, unspecified: Secondary | ICD-10-CM | POA: Insufficient documentation

## 2018-02-18 DIAGNOSIS — Z113 Encounter for screening for infections with a predominantly sexual mode of transmission: Secondary | ICD-10-CM | POA: Insufficient documentation

## 2018-02-18 DIAGNOSIS — Z124 Encounter for screening for malignant neoplasm of cervix: Secondary | ICD-10-CM | POA: Diagnosis not present

## 2018-02-18 DIAGNOSIS — Z202 Contact with and (suspected) exposure to infections with a predominantly sexual mode of transmission: Secondary | ICD-10-CM | POA: Insufficient documentation

## 2018-02-18 NOTE — Assessment & Plan Note (Signed)
Pap smear collected today 

## 2018-02-18 NOTE — Assessment & Plan Note (Signed)
Suspect PCOS patient seems to meet [place] criteria.  Check testosterone and TSH.  Discussed possibility of starting metformin with patient pending bloodwork, she is agreeable.

## 2018-02-18 NOTE — Patient Instructions (Signed)
It was good to see you today!   We are checking some labs today. If results require attention, either myself or my nurse will get in touch with you. If everything is normal, you will get a letter in the mail or a message in My Chart. Please give us a call if you do not hear from us after 2 weeks.  Please bring all of your medications with you to each visit.   Sign up for My Chart to have easy access to your labs results, and communication with your primary care physician.  Feel free to call with any questions or concerns at any time, at [phone].   Take care,  Dr. Detrice Cales J Zhamir Pirro, DO [facility]  

## 2018-02-18 NOTE — Assessment & Plan Note (Signed)
-   HIV Antibody (routine testing w rflx) - RPR - Cytology - PAP([place]) - with GC/CT and trich

## 2018-02-18 NOTE — Progress Notes (Signed)
    Subjective:  Angelica Griffin is a [age] y.o. female who [facility]  HPI:  Here for pap smear. Wants STD testing. Not having any symptoms but just wants to be sure. Also wants help with getting pregnant as she has been in a monogamous relationship having regular sex for 2 years.  She has a history of irregular periods, they have been more regular recently over the last few months however before that she was having several in a month.  She recalls a time when she had to come in and get blood work because she has not had a period for very long time.  Her sister has similar issues. She thinks that her cycles have become more regular since she lost a little bit of weight. She has had some history of facial hair that she shaved.  He had acne during her teenage years but none recently.   ROS: Per HPI, otherwise all systems reviewed and negative  PMH:  The following were reviewed and entered/updated in epic: Past Medical History:  Diagnosis Date  . Asthma   . GOITER NOS 04/26/2006   Qualifier: Diagnosis of  By: Bradly BienenstockHarrelson, Kathy    . Obesity    Patient Active Problem List   Diagnosis Date Noted  . Healthcare maintenance 02/08/2018  . Allergic rhinitis 07/11/2013  . ASTHMA, INTERMITTENT 10/30/2007  . OBESITY, NOS 04/26/2006   History reviewed. No pertinent surgical history.  Family History  Problem Relation Age of Onset  . Diabetes Maternal Grandmother   . Hypertension Maternal Grandmother     Medications- reviewed and updated Current Outpatient Medications  Medication Sig Dispense Refill  . albuterol (PROAIR HFA) 108 (90 BASE) MCG/ACT inhaler INHALE 2 PUFFS INTO THE LUNGS EVERY 6 (SIX) HOURS AS NEEDED FOR WHEEZING. 8.5 each 0   No current facility-administered medications for this visit.      Social History   Tobacco Use  . Smoking status: Former Smoker    Types: Cigars    Last attempt to quit: 02/28/2011    Years since quitting: 6.9  .  Smokeless tobacco: Never Used  Substance Use Topics  . Alcohol use: No  . Drug use: Yes    Types: Marijuana     Objective:  Physical Exam: BP (!) 106/58   Pulse 79   Temp 98.2 F (36.8 C) (Oral)   Ht 5\' 3"  (1.6 m)   Wt 230 lb 2 oz (104.4 kg)   LMP 02/03/2018 (Exact Date)   SpO2 99%   BMI 40.76 kg/m   Gen: NAD, resting comfortably Pulm: NWOB GI: Soft, Nontender, Nondistended. Pelvic exam: normal external genitalia, vulva, vagina, cervix, uterus and adnexa. MSK: no edema, cyanosis, or clubbing noted Skin: warm, dry Neuro: grossly normal, moves all extremities Psych: Normal affect and thought content   Assessment/Plan:  Routine Papanicolaou smear Pap smear collected today.   STD exposure - HIV Antibody (routine testing w rflx) - RPR - Cytology - PAP([place]) - with GC/CT and trich   Infertility, female Suspect PCOS patient seems to meet [place] criteria.  Check testosterone and TSH.  Discussed possibility of starting metformin with patient pending bloodwork, she is agreeable.   Leland HerElsia J Jaray Boliver, DO PGY-3, [facility] 02/18/2018 2:47 PM

## 2018-02-19 LAB — TSH: TSH: 1.09 u[IU]/mL (ref 0.450–4.500)

## 2018-02-19 LAB — HIV ANTIBODY (ROUTINE TESTING W REFLEX): HIV Screen 4th Generation wRfx: NONREACTIVE

## 2018-02-19 LAB — TESTOSTERONE: Testosterone: 81 ng/dL — ABNORMAL HIGH (ref 8–48)

## 2018-02-19 LAB — RPR: RPR Ser Ql: NONREACTIVE

## 2018-02-21 ENCOUNTER — Other Ambulatory Visit: Payer: Self-pay | Admitting: Family Medicine

## 2018-02-21 ENCOUNTER — Telehealth: Payer: Self-pay | Admitting: Family Medicine

## 2018-02-21 MED ORDER — METFORMIN HCL 1000 MG PO TABS: 1000.0000 mg | ORAL_TABLET | Freq: Two times a day (BID) | ORAL | 2 refills | Status: DC

## 2018-02-21 NOTE — Progress Notes (Signed)
Reordered as electronic rx.

## 2018-02-21 NOTE — Telephone Encounter (Signed)
Discussed results c/w PCOS with patient. She is agreeable to starting metformin. Rx sent to patient pharmacy. Informed that all other labs were normal.

## 2018-02-25 LAB — CYTOLOGY - PAP
CHLAMYDIA, DNA PROBE: NEGATIVE
Diagnosis: NEGATIVE
NEISSERIA GONORRHEA: NEGATIVE
TRICH (WINDOWPATH): NEGATIVE

## 2018-04-17 ENCOUNTER — Ambulatory Visit (INDEPENDENT_AMBULATORY_CARE_PROVIDER_SITE_OTHER): Payer: No Typology Code available for payment source | Admitting: Family Medicine

## 2018-04-17 ENCOUNTER — Encounter: Payer: Self-pay | Admitting: Family Medicine

## 2018-04-17 ENCOUNTER — Other Ambulatory Visit (HOSPITAL_COMMUNITY)
Admission: RE | Admit: 2018-04-17 | Discharge: 2018-04-17 | Disposition: A | Discharge status: Home / Self Care | Payer: No Typology Code available for payment source | Source: Ambulatory Visit | Attending: Family Medicine | Admitting: Family Medicine

## 2018-04-17 ENCOUNTER — Other Ambulatory Visit: Payer: Self-pay

## 2018-04-17 VITALS — BP 122/70 | HR 69 | Temp 98.3°F | Wt 231.2 lb

## 2018-04-17 DIAGNOSIS — Z202 Contact with and (suspected) exposure to infections with a predominantly sexual mode of transmission: Secondary | ICD-10-CM | POA: Diagnosis not present

## 2018-04-17 DIAGNOSIS — N898 Other specified noninflammatory disorders of vagina: Secondary | ICD-10-CM | POA: Insufficient documentation

## 2018-04-17 LAB — POCT WET PREP (WET MOUNT)
CLUE CELLS WET PREP WHIFF POC: POSITIVE
Trichomonas Wet Prep HPF POC: ABSENT

## 2018-04-17 MED ORDER — METRONIDAZOLE 500 MG PO TABS: 500.0000 mg | ORAL_TABLET | Freq: Two times a day (BID) | ORAL | 0 refills | Status: AC

## 2018-04-17 MED ORDER — FLUCONAZOLE 150 MG PO TABS: 150.0000 mg | ORAL_TABLET | Freq: Once | ORAL | 0 refills | Status: AC

## 2018-04-17 NOTE — Assessment & Plan Note (Signed)
Confirmed on wet prep along with yeast. G/C /Chlamydia, HIV, RPR is pending. Symptoms consistent with this.  - Treatment with Flagyl 500 BID x 7 days.  Diflucan 2 tabs to be taken 3 days apart after completion of metronidazole for yeast. - F/U if symptoms not improving or getting worse.  - Will f/u on G/C Chlamydia and call in Rx if positive.  - Self care instructions given including avoiding douching. Handout given.  - F/U with PCP as needed.  - Return precautions including abdominal pain, fever, chills, nausea, or vomiting given.

## 2018-04-17 NOTE — Progress Notes (Signed)
  Subjective:    Patient ID: Angelica Griffin, female    DOB: 04-09-1995, 23 y.o.   MRN: [id number]   CC: Vaginal discharge  HPI:  Vaginal Discharge - has been ongoing for 2-3 days.  - Denies itching, burning, abdominal pain, nausea or vomiting; more of an uncomfortable feeling - Discharge described as thick and white.  - Patient reports having trich in the past.  - Sexully active with one female partner. Does not think he is having sex with any one else.  - LMP was on 04/04/2018.  - Denies burning with urination, no hematuria.  - Patient reports she had BV in the past or yeast.     Smoking status reviewed  ROS: 10 point ROS is otherwise negative, except as mentioned in HPI  Patient Active Problem List   Diagnosis Date Noted  . Vaginal discharge 04/17/2018  . Infertility, female 02/18/2018  . STD exposure 02/18/2018  . Routine Papanicolaou smear 02/08/2018  . Allergic rhinitis 07/11/2013  . ASTHMA, INTERMITTENT 10/30/2007  . OBESITY, NOS 04/26/2006     Objective:  BP 122/70   Pulse 69   Temp 98.3 F (36.8 C) (Oral)   Wt 231 lb 4 oz (104.9 kg)   LMP 04/04/2018   SpO2 98%   BMI 40.96 kg/m  Vitals and nursing note reviewed  General: NAD, pleasant Respiratory: normal effort GU/GYN: External genitalia within normal limits.  Vaginal mucosa pink, moist, normal rugae.  Nonfriable cervix without lesions, thin white/yellow discharge with no bleeding noted on speculum exam. Exam performed in the presence of a chaperone. Extremities: no edema or cyanosis. WWP. Skin: warm and dry, no rashes noted Neuro: alert and oriented, no focal deficits Psych: normal affect  Assessment & Plan:    STD exposure We will check RPR, HIV.  Vaginal discharge Confirmed on wet prep along with yeast. G/C /Chlamydia, HIV, RPR is pending. Symptoms consistent with this.  - Treatment with Flagyl 500 BID x 7 days.  Diflucan 2 tabs to be taken 3 days apart after completion of metronidazole for  yeast. - F/U if symptoms not improving or getting worse.  - Will f/u on G/C Chlamydia and call in Rx if positive.  - Self care instructions given including avoiding douching. Handout given.  - F/U with PCP as needed.  - Return precautions including abdominal pain, fever, chills, nausea, or vomiting given.    [place] Jory Welke, DO Family Medicine Resident PGY-2

## 2018-04-17 NOTE — Patient Instructions (Addendum)
Thank you for coming to see me today. It was a pleasure! Today we talked about:   Your wet prep shows bacterial vaginosis and yeast.  Please take metronidazole twice daily for 7 days and then after you have completed treatment of all, please take Diflucan 1 tablet.  3 days later you may take the second tablet of Diflucan.  If you continue to have any symptoms after this treatment and please do not hesitate to return to the clinic.  Please follow-up as needed.  If you have any questions or concerns, please do not hesitate to call the office at [phone].  Take Care,   Angelica Janayla Marik, DO  Bacterial Vaginosis  Bacterial vaginosis is an infection of the vagina. It happens when too many normal germs (healthy bacteria) grow in the vagina. This infection puts you at risk for infections from sex (STIs). Treating this infection can lower your risk for some STIs. You should also treat this if you are pregnant. It can cause your baby to be born early. Follow these instructions at home: Medicines  Take over-the-counter and prescription medicines only as told by your doctor.  Take or use your antibiotic medicine as told by your doctor. Do not stop taking or using it even if you start to feel better. General instructions  If you your sexual partner is a woman, tell her that you have this infection. She needs to get treatment if she has symptoms. If you have a female partner, he does not need to be treated.  During treatment: ? Avoid sex. ? Do not douche. ? Avoid alcohol as told. ? Avoid breastfeeding as told.  Drink enough fluid to keep your pee (urine) clear or pale yellow.  Keep your vagina and butt (rectum) clean. ? Wash the area with warm water every day. ? Wipe from front to back after you use the toilet.  Keep all follow-up visits as told by your doctor. This is important. Preventing this condition  Do not douche.  Use only warm water to wash around your vagina.  Use protection  when you have sex. This includes: ? Latex condoms. ? Dental dams.  Limit how many people you have sex with. It is best to only have sex with the same person (be monogamous).  Get tested for STIs. Have your partner get tested.  Wear underwear that is cotton or lined with cotton.  Avoid tight pants and pantyhose. This is most important in summer.  Do not use any products that have nicotine or tobacco in them. These include cigarettes and e-cigarettes. If you need help quitting, ask your doctor.  Do not use illegal drugs.  Limit how much alcohol you drink. Contact a doctor if:  Your symptoms do not get better, even after you are treated.  You have more discharge or pain when you pee (urinate).  You have a fever.  You have pain in your belly (abdomen).  You have pain with sex.  Your bleed from your vagina between periods. Summary  This infection happens when too many germs (bacteria) grow in the vagina.  Treating this condition can lower your risk for some infections from sex (STIs).  You should also treat this if you are pregnant. It can cause early (premature) birth.  Do not stop taking or using your antibiotic medicine even if you start to feel better. This information is not intended to replace advice given to you by your health care provider. Make sure you discuss any questions you  have with your health care provider. Document Released: 11/23/2007 Document Revised: 10/30/2015 Document Reviewed: 10/30/2015 Elsevier Interactive Patient Education  2019 [employer].

## 2018-04-17 NOTE — Assessment & Plan Note (Signed)
We will check RPR, HIV.

## 2018-04-18 LAB — HIV ANTIBODY (ROUTINE TESTING W REFLEX): HIV Screen 4th Generation wRfx: NONREACTIVE

## 2018-04-18 LAB — RPR: RPR Ser Ql: NONREACTIVE

## 2018-04-19 LAB — CERVICOVAGINAL ANCILLARY ONLY
Chlamydia: NEGATIVE
Neisseria Gonorrhea: NEGATIVE

## 2018-04-30 ENCOUNTER — Encounter: Payer: Self-pay | Admitting: Family Medicine

## 2018-05-01 ENCOUNTER — Encounter: Payer: Self-pay | Admitting: Family Medicine

## 2018-05-02 ENCOUNTER — Ambulatory Visit (INDEPENDENT_AMBULATORY_CARE_PROVIDER_SITE_OTHER): Payer: No Typology Code available for payment source | Admitting: Family Medicine

## 2018-05-02 ENCOUNTER — Other Ambulatory Visit: Payer: Self-pay

## 2018-05-02 VITALS — BP 120/64 | HR 71 | Temp 97.7°F | Wt 229.1 lb

## 2018-05-02 DIAGNOSIS — N898 Other specified noninflammatory disorders of vagina: Secondary | ICD-10-CM | POA: Diagnosis not present

## 2018-05-02 LAB — POCT WET PREP (WET MOUNT)
Clue Cells Wet Prep Whiff POC: NEGATIVE
Trichomonas Wet Prep HPF POC: ABSENT

## 2018-05-02 NOTE — Progress Notes (Signed)
    Subjective:  Angelica Griffin is a 23 y.o. female who presents to the [facility] today with a chief complaint of vaginal discharge.   HPI:  Patient was seen for vaginal discharge on 04/17/18 and was treated for BV and subsequently treated for yeast infection. Griffin last dose of yeast infection medication was on Sunday.  She would like to be recheck today because she is still have vaginal discharge with a strong odor. No bleeding or itching or burning. No urinary symptoms. No abdominal pain. She has not been sexually active since Griffin last visit.  ROS: Per HPI   Objective:  Physical Exam: BP 120/64   Pulse 71   Temp 97.7 F (36.5 C) (Oral)   Wt 229 lb 2 oz (103.9 kg)   LMP 04/04/2018   SpO2 99%   BMI 40.59 kg/m   Gen: NAD, resting comfortably Pelvic exam: normal external genitalia, vulva, vagina, cervix Skin: warm, dry Neuro: grossly normal, moves all extremities Psych: Normal affect and thought content  Results for orders placed or performed in visit on 05/02/18 (from the past 72 hour(s))  POCT Wet Prep Angelica Griffin)     Status: Abnormal   Collection Time: 05/02/18 10:45 AM  Result Value Ref Range   Source Wet Prep POC VAG    WBC, Wet Prep HPF POC 1-5    Bacteria Wet Prep HPF POC Moderate (A) Few   Clue Cells Wet Prep HPF POC None None   Clue Cells Wet Prep Whiff POC Negative Whiff    Yeast Wet Prep HPF POC None None   Trichomonas Wet Prep HPF POC Absent Absent     Assessment/Plan:  1. Vaginal discharge Wet prep negative. Discussed with patient that she likely has residual resolving irritation without any acute infection. Patient voiced good understanding and appreciation.  - POCT Wet Prep [facility])   Angelica Her, DO PGY-3, [facility] 05/02/2018 10:30 AM

## 2018-11-30 ENCOUNTER — Other Ambulatory Visit: Payer: Self-pay

## 2018-11-30 ENCOUNTER — Emergency Department (HOSPITAL_COMMUNITY)
Admission: EM | Admit: 2018-11-30 | Discharge: 2018-11-30 | Disposition: A | Discharge status: Home / Self Care | Payer: No Typology Code available for payment source | Attending: Emergency Medicine | Admitting: Emergency Medicine

## 2018-11-30 ENCOUNTER — Encounter (HOSPITAL_COMMUNITY): Payer: Self-pay | Admitting: Emergency Medicine

## 2018-11-30 DIAGNOSIS — S161XXA Strain of muscle, fascia and tendon at neck level, initial encounter: Secondary | ICD-10-CM | POA: Insufficient documentation

## 2018-11-30 DIAGNOSIS — Z7984 Long term (current) use of oral hypoglycemic drugs: Secondary | ICD-10-CM | POA: Insufficient documentation

## 2018-11-30 DIAGNOSIS — Y999 Unspecified external cause status: Secondary | ICD-10-CM | POA: Insufficient documentation

## 2018-11-30 DIAGNOSIS — J45909 Unspecified asthma, uncomplicated: Secondary | ICD-10-CM | POA: Insufficient documentation

## 2018-11-30 DIAGNOSIS — Y939 Activity, unspecified: Secondary | ICD-10-CM | POA: Insufficient documentation

## 2018-11-30 DIAGNOSIS — Y92410 Unspecified street and highway as the place of occurrence of the external cause: Secondary | ICD-10-CM | POA: Insufficient documentation

## 2018-11-30 DIAGNOSIS — F17290 Nicotine dependence, other tobacco product, uncomplicated: Secondary | ICD-10-CM | POA: Insufficient documentation

## 2018-11-30 DIAGNOSIS — Z91040 Latex allergy status: Secondary | ICD-10-CM | POA: Insufficient documentation

## 2018-11-30 MED ORDER — METHOCARBAMOL 750 MG PO TABS: 750.0000 mg | ORAL_TABLET | Freq: Three times a day (TID) | ORAL | 0 refills | Status: DC | PRN

## 2018-11-30 NOTE — Discharge Instructions (Addendum)
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
## 2019-10-16 ENCOUNTER — Ambulatory Visit (HOSPITAL_COMMUNITY)
Admission: EM | Admit: 2019-10-16 | Discharge: 2019-10-16 | Disposition: A | Discharge status: Home / Self Care | Payer: Self-pay | Attending: Family Medicine | Admitting: Family Medicine

## 2019-10-16 ENCOUNTER — Other Ambulatory Visit: Payer: Self-pay
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
## 2019-10-20 ENCOUNTER — Ambulatory Visit (HOSPITAL_COMMUNITY)
Admission: EM | Admit: 2019-10-20 | Discharge: 2019-10-20 | Disposition: A | Discharge status: Home / Self Care | Payer: No Typology Code available for payment source | Attending: Internal Medicine | Admitting: Internal Medicine

## 2019-10-20 ENCOUNTER — Other Ambulatory Visit: Payer: Self-pay

## 2019-10-20 DIAGNOSIS — Z1152 Encounter for screening for COVID-19: Secondary | ICD-10-CM | POA: Diagnosis not present

## 2019-10-20 DIAGNOSIS — Z20822 Contact with and (suspected) exposure to covid-19: Secondary | ICD-10-CM | POA: Insufficient documentation

## 2019-10-20 LAB — SARS CORONAVIRUS 2 (TAT 6-24 HRS): SARS Coronavirus 2: NEGATIVE

## 2019-10-20 NOTE — Discharge Instructions (Signed)
If your Covid-19 test is positive, you will get a phone call from [facility] regarding your results. If your Covid-19 test is negative, you will NOT get a phone call from [facility] with your results. You may view your results on MyChart. If you do not have a MyChart account, sign up instructions are in your discharge papers. ° °

## 2019-10-20 NOTE — ED Triage Notes (Signed)
Pt presents for covid testing with no known symptoms. 

## 2019-11-17 ENCOUNTER — Other Ambulatory Visit (HOSPITAL_COMMUNITY)
Admission: RE | Admit: 2019-11-17 | Discharge: 2019-11-17 | Disposition: A | Discharge status: Home / Self Care | Payer: Self-pay | Source: Ambulatory Visit | Attending: Family Medicine | Admitting: Family Medicine

## 2019-11-17 ENCOUNTER — Ambulatory Visit (INDEPENDENT_AMBULATORY_CARE_PROVIDER_SITE_OTHER): Payer: Self-pay | Admitting: Family Medicine

## 2019-11-17 ENCOUNTER — Other Ambulatory Visit: Payer: Self-pay

## 2019-11-17 VITALS — BP 100/66 | HR 79 | Ht 63.0 in | Wt 223.6 lb

## 2019-11-17 DIAGNOSIS — B9689 Other specified bacterial agents as the cause of diseases classified elsewhere: Secondary | ICD-10-CM

## 2019-11-17 DIAGNOSIS — Z113 Encounter for screening for infections with a predominantly sexual mode of transmission: Secondary | ICD-10-CM | POA: Insufficient documentation

## 2019-11-17 DIAGNOSIS — Z202 Contact with and (suspected) exposure to infections with a predominantly sexual mode of transmission: Secondary | ICD-10-CM

## 2019-11-17 DIAGNOSIS — N760 Acute vaginitis: Secondary | ICD-10-CM

## 2019-11-17 LAB — POCT WET PREP (WET MOUNT)
Clue Cells Wet Prep Whiff POC: POSITIVE
Trichomonas Wet Prep HPF POC: ABSENT

## 2019-11-17 NOTE — Assessment & Plan Note (Signed)
Patient with recent exposure about a week ago.  Will obtain cytology and wet prep.  No blood labs at this time.  Have recommended that patient follow-up in 2 to 3 months for blood labs given recent exposure.  Patient does not use any contraception but does decline UPT at this time.

## 2019-11-17 NOTE — Progress Notes (Signed)
    SUBJECTIVE:   CHIEF COMPLAINT / HPI:   STD testing  One STD in the past.  Patient with exposure to possible STD within the last week. No complaints or symptoms at this time. Does not use any contraception. Not interested in it at this time.   PERTINENT  PMH / PSH: hx of one STD   OBJECTIVE:   BP 100/66   Pulse 79   Ht 5\' 3"  (1.6 m)   Wt 223 lb 9.6 oz (101.4 kg)   LMP 11/14/2019 (Approximate)   SpO2 98%   BMI 39.61 kg/m   General: well appearing  GU: External vulva and vagina nonerythematous, without any obvious lesions or rash. Normal ruggae of vaginal walls.  Cervix is non erythematous and non-friable.    ASSESSMENT/PLAN:   STD exposure Patient with recent exposure about a week ago.  Will obtain cytology and wet prep.  No blood labs at this time.  Have recommended that patient follow-up in 2 to 3 months for blood labs given recent exposure.  Patient does not use any contraception but does decline UPT at this time.    11/16/2019, MD [facility]

## 2019-11-18 ENCOUNTER — Encounter: Payer: Self-pay | Admitting: Family Medicine

## 2019-11-18 LAB — CERVICOVAGINAL ANCILLARY ONLY
Chlamydia: NEGATIVE
Comment: NEGATIVE
Comment: NORMAL
Neisseria Gonorrhea: NEGATIVE

## 2019-11-20 DIAGNOSIS — B9689 Other specified bacterial agents as the cause of diseases classified elsewhere: Secondary | ICD-10-CM | POA: Insufficient documentation

## 2019-11-20 DIAGNOSIS — N760 Acute vaginitis: Secondary | ICD-10-CM | POA: Insufficient documentation

## 2019-11-20 MED ORDER — METRONIDAZOLE 500 MG PO TABS: 500.0000 mg | ORAL_TABLET | Freq: Two times a day (BID) | ORAL | 0 refills | Status: AC

## 2019-11-20 NOTE — Assessment & Plan Note (Signed)
Metronidazole to the pharmacy

## 2019-11-21 ENCOUNTER — Telehealth: Payer: Self-pay | Admitting: *Deleted

## 2019-11-21 NOTE — Telephone Encounter (Signed)
-----   Message from Melene Plan, MD sent at 11/20/2019 10:07 AM EDT ----- Please call this patient and let her know about BV. I have sent metronidazole to the pharamcy.

## 2019-11-21 NOTE — Telephone Encounter (Signed)
Pt informed.Emelina Hinch Zimmerman Rumple, CMA ° °

## 2020-01-29 ENCOUNTER — Encounter (HOSPITAL_COMMUNITY): Payer: Self-pay

## 2020-01-29 ENCOUNTER — Other Ambulatory Visit: Payer: Self-pay

## 2020-01-29 ENCOUNTER — Ambulatory Visit (HOSPITAL_COMMUNITY)
Admission: EM | Admit: 2020-01-29 | Discharge: 2020-01-29 | Disposition: A | Discharge status: Home / Self Care | Payer: Self-pay | Attending: Internal Medicine | Admitting: Internal Medicine

## 2020-01-29 DIAGNOSIS — Z20822 Contact with and (suspected) exposure to covid-19: Secondary | ICD-10-CM | POA: Insufficient documentation

## 2020-01-29 DIAGNOSIS — B349 Viral infection, unspecified: Secondary | ICD-10-CM | POA: Insufficient documentation

## 2020-01-29 LAB — RESP PANEL BY RT-PCR (FLU A&B, COVID) ARPGX2
Influenza A by PCR: NEGATIVE
Influenza B by PCR: NEGATIVE
SARS Coronavirus 2 by RT PCR: NEGATIVE

## 2020-01-29 MED ORDER — ONDANSETRON 4 MG PO TBDP: 4.0000 mg | ORAL_TABLET | Freq: Three times a day (TID) | ORAL | 0 refills | Status: DC | PRN

## 2020-01-29 MED ORDER — BENZONATATE 100 MG PO CAPS: 100.0000 mg | ORAL_CAPSULE | Freq: Three times a day (TID) | ORAL | 0 refills | Status: DC

## 2020-01-29 NOTE — ED Triage Notes (Signed)
Pt presents with non productive cough, sore throat, generalized body aches, and shortness of breath X 2 days.

## 2020-01-29 NOTE — Discharge Instructions (Addendum)
Push oral fluids Please quarantine until COVID-19 test results are available Please sign up for MyChart so you can access your lab results We will call you if your labs are significant.

## 2020-01-30 NOTE — ED Provider Notes (Signed)
[facility]    CSN: [id number] Arrival date & time: 01/29/20  0920      History   Chief Complaint Chief Complaint  Patient presents with  . Cough  . Shortness of Breath  . Sore Throat  . Generalized Body Aches    HPI Angelica Griffin is a 24 y.o. female comes to the urgent care with 2-day history of cough, sore throat and generalized body aches as well as shortness of breath.  Symptoms started insidiously and has gotten progressively worse.  Patient denies any loss of taste or smell.  Patient complains of nausea but no vomiting.  No diarrhea.  No sick contacts.  Patient is not immunized against COVID-19 virus.   HPI  Past Medical History:  Diagnosis Date  . Asthma   . GOITER NOS 04/26/2006   Qualifier: Diagnosis of  By: Bradly Bienenstock    . Obesity     Patient Active Problem List   Diagnosis Date Noted  . Bacterial vaginosis 11/20/2019  . Vaginal discharge 04/17/2018  . Infertility, female 02/18/2018  . STD exposure 02/18/2018  . Routine Papanicolaou smear 02/08/2018  . Allergic rhinitis 07/11/2013  . ASTHMA, INTERMITTENT 10/30/2007  . OBESITY, NOS 04/26/2006    History reviewed. No pertinent surgical history.  OB History   No obstetric history on file.      Home Medications    Prior to Admission medications   Medication Sig Start Date End Date Taking? Authorizing Provider  albuterol (PROAIR HFA) 108 (90 BASE) MCG/ACT inhaler INHALE 2 PUFFS INTO THE LUNGS EVERY 6 (SIX) HOURS AS NEEDED FOR WHEEZING. 11/10/14   Melancon, Hillery Hunter, MD  benzonatate (TESSALON) 100 MG capsule Take 1 capsule (100 mg total) by mouth every 8 (eight) hours. 01/29/20   Merrilee Jansky, MD  metFORMIN (GLUCOPHAGE) 1000 MG tablet Take 1 tablet (1,000 mg total) by mouth 2 (two) times daily with a meal. 02/21/18   Leland Her, DO  methocarbamol (ROBAXIN) 750 MG tablet Take 1-2 tablets (750-1,500 mg total) by mouth 3 (three) times daily as needed for muscle spasms. 11/30/18    Cristina Gong, PA-C  ondansetron (ZOFRAN ODT) 4 MG disintegrating tablet Take 1 tablet (4 mg total) by mouth every 8 (eight) hours as needed for nausea or vomiting. 01/29/20   LampteyBritta Mccreedy, MD    Family History Family History  Problem Relation Age of Onset  . Diabetes Maternal Grandmother   . Hypertension Maternal Grandmother     Social History Social History   Tobacco Use  . Smoking status: Former Smoker    Types: Cigars    Quit date: 02/28/2011    Years since quitting: 8.9  . Smokeless tobacco: Never Used  Vaping Use  . Vaping Use: Never used  Substance Use Topics  . Alcohol use: No  . Drug use: Yes    Types: Marijuana     Allergies   Latex and Chlorine   Review of Systems Review of Systems  Constitutional: Negative.   HENT: Positive for congestion and sore throat.   Respiratory: Positive for cough and shortness of breath. Negative for wheezing.   Cardiovascular: Negative for chest pain.  Gastrointestinal: Negative for abdominal pain.  Genitourinary: Negative.   Musculoskeletal: Positive for arthralgias and myalgias.  Neurological: Negative.      Physical Exam Triage Vital Signs ED Triage Vitals  Enc Vitals Group     BP 01/29/20 1019 118/70     Pulse Rate 01/29/20 1019 81  Resp 01/29/20 1019 16     Temp 01/29/20 1019 99 F (37.2 C)     Temp Source 01/29/20 1019 Oral     SpO2 01/29/20 1019 100 %     Weight --      Height --      Head Circumference --      Peak Flow --      Pain Score 01/29/20 1020 4     Pain Loc --      Pain Edu? --      Excl. in GC? --    No data found.  Updated Vital Signs BP 118/70 (BP Location: Right Arm)   Pulse 81   Temp 99 F (37.2 C) (Oral)   Resp 16   SpO2 100%   Visual Acuity Right Eye Distance:   Left Eye Distance:   Bilateral Distance:    Right Eye Near:   Left Eye Near:    Bilateral Near:     Physical Exam Vitals and nursing note reviewed.  Constitutional:      General: She is not in  acute distress.    Appearance: She is well-developed. She is not ill-appearing.  Cardiovascular:     Rate and Rhythm: Normal rate and regular rhythm.  Pulmonary:     Effort: Pulmonary effort is normal.     Breath sounds: Normal breath sounds. No decreased breath sounds, wheezing, rhonchi or rales.  Abdominal:     Palpations: Abdomen is soft.  Neurological:     Mental Status: She is alert.      UC Treatments / Results  Labs (all labs ordered are listed, but only abnormal results are displayed) Labs Reviewed  RESP PANEL BY RT-PCR (FLU A&B, COVID) [id number]    EKG   Radiology No results found.  Procedures Procedures (including critical care time)  Medications Ordered in UC Medications - No data to display  Initial Impression / Assessment and Plan / UC Course  I have reviewed the triage vital signs and the nursing notes.  Pertinent labs & imaging results that were available during my care of the patient were reviewed by me and considered in my medical decision making (see chart for details).     1.  Acute viral syndrome: Respiratory panel PCR for influenza A/B/COVID-19 virus Tessalon Perles as needed for cough Zofran as needed for nausea/vomiting Patient advised to quarantine until COVID-19 test results are available If symptoms worsen patient advised to return to the urgent care to be reevaluated. Final Clinical Impressions(s) / UC Diagnoses   Final diagnoses:  Acute viral syndrome     Discharge Instructions     Push oral fluids Please quarantine until COVID-19 test results are available Please sign up for MyChart so you can access your lab results We will call you if your labs are significant.     ED Prescriptions    Medication Sig Dispense Auth. Provider   ondansetron (ZOFRAN ODT) 4 MG disintegrating tablet Take 1 tablet (4 mg total) by mouth every 8 (eight) hours as needed for nausea or vomiting. 20 tablet Saniya Tranchina, Britta Mccreedy, MD   benzonatate (TESSALON)  100 MG capsule Take 1 capsule (100 mg total) by mouth every 8 (eight) hours. 21 capsule Buffi Ewton, Britta Mccreedy, MD     PDMP not reviewed this encounter.   Merrilee Jansky, MD 01/30/20 [phone]

## 2020-03-09 ENCOUNTER — Other Ambulatory Visit: Payer: Self-pay

## 2020-03-09 DIAGNOSIS — Z20822 Contact with and (suspected) exposure to covid-19: Secondary | ICD-10-CM

## 2020-03-12 ENCOUNTER — Encounter: Payer: Self-pay | Admitting: Family Medicine

## 2020-03-12 LAB — NOVEL CORONAVIRUS, NAA

## 2021-02-07 ENCOUNTER — Ambulatory Visit: Payer: Self-pay | Admitting: Family Medicine

## 2021-08-02 ENCOUNTER — Encounter: Payer: Self-pay | Admitting: *Deleted

## 2022-02-08 ENCOUNTER — Ambulatory Visit
Admission: EM | Admit: 2022-02-08 | Discharge: 2022-02-08 | Disposition: A | Discharge status: Home / Self Care | Payer: Self-pay | Attending: Physician Assistant | Admitting: Physician Assistant

## 2022-02-08 DIAGNOSIS — J4521 Mild intermittent asthma with (acute) exacerbation: Secondary | ICD-10-CM

## 2022-02-08 MED ORDER — ALBUTEROL SULFATE (2.5 MG/3ML) 0.083% IN NEBU: 2.5000 mg | INHALATION_SOLUTION | Freq: Four times a day (QID) | RESPIRATORY_TRACT | 0 refills | Status: DC | PRN

## 2022-02-08 MED ORDER — PREDNISONE 20 MG PO TABS: 40.0000 mg | ORAL_TABLET | Freq: Every day | ORAL | 0 refills | Status: AC

## 2022-02-08 NOTE — ED Triage Notes (Signed)
Pt c/o cough, fever at home. States these sxs have resolved but pt still has an underlying wheeze without the cough.   Onset ~ Wednesday

## 2022-02-08 NOTE — ED Provider Notes (Signed)
[facility]    CSN: [id number] Arrival date & time: 02/08/22  1458      History   Chief Complaint Chief Complaint  Patient presents with   Cough    HPI Angelica Griffin is a 26 y.o. female.   Patient here today for evaluation of cough and wheezing that has persisted since upper respiratory infection about a week ago.  She reports that she has been using her inhaler without significant improvement.  She notes that most of her upper respiratory symptoms have improved and she has not had recent fever.  She denies any nausea or vomiting.  She has not had any diarrhea.  The history is provided by the patient.  Cough Associated symptoms: shortness of breath and wheezing   Associated symptoms: no chills, no ear pain, no eye discharge, no fever and no sore throat     Past Medical History:  Diagnosis Date   Asthma    GOITER NOS 04/26/2006   Qualifier: Diagnosis of  By: Beryle Lathe     Obesity     Patient Active Problem List   Diagnosis Date Noted   Bacterial vaginosis 11/20/2019   Vaginal discharge 04/17/2018   Infertility, female 02/18/2018   STD exposure 02/18/2018   Routine Papanicolaou smear 02/08/2018   Allergic rhinitis 07/11/2013   ASTHMA, INTERMITTENT 10/30/2007   OBESITY, NOS 04/26/2006    History reviewed. No pertinent surgical history.  OB History   No obstetric history on file.      Home Medications    Prior to Admission medications   Medication Sig Start Date End Date Taking? Authorizing Provider  albuterol (PROVENTIL) (2.5 MG/3ML) 0.083% nebulizer solution Take 3 mLs (2.5 mg total) by nebulization every 6 (six) hours as needed for wheezing or shortness of breath. 02/08/22  Yes Francene Finders, PA-C  predniSONE (DELTASONE) 20 MG tablet Take 2 tablets (40 mg total) by mouth daily with breakfast for 5 days. 02/08/22 02/13/22 Yes Francene Finders, PA-C  albuterol (PROAIR HFA) 108 (90 BASE) MCG/ACT inhaler INHALE 2 PUFFS INTO THE  LUNGS EVERY 6 (SIX) HOURS AS NEEDED FOR WHEEZING. 11/10/14   Melancon, York Ram, MD    Family History Family History  Problem Relation Age of Onset   Diabetes Maternal Grandmother    Hypertension Maternal Grandmother     Social History Social History   Tobacco Use   Smoking status: Former    Types: Cigars    Quit date: 02/28/2011    Years since quitting: 10.9   Smokeless tobacco: Never  Vaping Use   Vaping Use: Never used  Substance Use Topics   Alcohol use: No   Drug use: Yes    Types: Marijuana     Allergies   Latex and Chlorine   Review of Systems Review of Systems  Constitutional:  Negative for chills and fever.  HENT:  Positive for congestion. Negative for ear pain and sore throat.   Eyes:  Negative for discharge and redness.  Respiratory:  Positive for cough, shortness of breath and wheezing.   Gastrointestinal:  Negative for abdominal pain, diarrhea, nausea and vomiting.     Physical Exam Triage Vital Signs ED Triage Vitals [02/08/22 1712]  Enc Vitals Group     BP (!) 141/92     Pulse Rate 60     Resp 16     Temp 98 F (36.7 C)     Temp Source Oral     SpO2 98 %  Weight      Height      Head Circumference      Peak Flow      Pain Score 0     Pain Loc      Pain Edu?      Excl. in GC?    No data found.  Updated Vital Signs BP (!) 141/92 (BP Location: Left Arm)   Pulse 60   Temp 98 F (36.7 C) (Oral)   Resp 16   SpO2 98%   Physical Exam Vitals and nursing note reviewed.  Constitutional:      General: She is not in acute distress.    Appearance: Normal appearance. She is not ill-appearing.  HENT:     Head: Normocephalic and atraumatic.     Nose: Nose normal. No congestion or rhinorrhea.     Mouth/Throat:     Mouth: Mucous membranes are moist.     Pharynx: Oropharynx is clear. No oropharyngeal exudate or posterior oropharyngeal erythema.  Eyes:     Conjunctiva/sclera: Conjunctivae normal.  Cardiovascular:     Rate and Rhythm:  Normal rate and regular rhythm.     Heart sounds: Normal heart sounds.  Pulmonary:     Effort: Pulmonary effort is normal. No respiratory distress.     Breath sounds: Wheezing present. No rhonchi or rales.  Neurological:     Mental Status: She is alert.      UC Treatments / Results  Labs (all labs ordered are listed, but only abnormal results are displayed) Labs Reviewed - No data to display  EKG   Radiology No results found.  Procedures Procedures (including critical care time)  Medications Ordered in UC Medications - No data to display  Initial Impression / Assessment and Plan / UC Course  I have reviewed the triage vital signs and the nursing notes.  Pertinent labs & imaging results that were available during my care of the patient were reviewed by me and considered in my medical decision making (see chart for details).    Will treat to cover asthma exacerbation with prednisone burst.  Albuterol prescribed for nebulizer as well.  Encouraged follow-up if no gradual improvement or with any worsening symptoms as chest xray may be indicated at this time.   Final Clinical Impressions(s) / UC Diagnoses   Final diagnoses:  Mild intermittent asthma with acute exacerbation   Discharge Instructions   None    ED Prescriptions     Medication Sig Dispense Auth. Provider   predniSONE (DELTASONE) 20 MG tablet Take 2 tablets (40 mg total) by mouth daily with breakfast for 5 days. 10 tablet Erma Pinto F, PA-C   albuterol (PROVENTIL) (2.5 MG/3ML) 0.083% nebulizer solution Take 3 mLs (2.5 mg total) by nebulization every 6 (six) hours as needed for wheezing or shortness of breath. 75 mL Tomi Bamberger, PA-C      PDMP not reviewed this encounter.   Tomi Bamberger, PA-C 02/08/22 1816

## 2022-08-11 ENCOUNTER — Encounter (HOSPITAL_BASED_OUTPATIENT_CLINIC_OR_DEPARTMENT_OTHER): Payer: Self-pay | Admitting: Emergency Medicine

## 2022-08-11 ENCOUNTER — Other Ambulatory Visit: Payer: Self-pay

## 2022-08-11 ENCOUNTER — Emergency Department (HOSPITAL_BASED_OUTPATIENT_CLINIC_OR_DEPARTMENT_OTHER): Payer: BLUE CROSS/BLUE SHIELD

## 2022-08-11 DIAGNOSIS — Z87891 Personal history of nicotine dependence: Secondary | ICD-10-CM | POA: Diagnosis not present

## 2022-08-11 DIAGNOSIS — J45909 Unspecified asthma, uncomplicated: Secondary | ICD-10-CM | POA: Insufficient documentation

## 2022-08-11 DIAGNOSIS — R55 Syncope and collapse: Secondary | ICD-10-CM | POA: Insufficient documentation

## 2022-08-11 LAB — CBG MONITORING, ED: Glucose-Capillary: 99 mg/dL (ref 70–99)

## 2022-08-11 NOTE — ED Triage Notes (Signed)
Pt presents to ED POV. Pt c/o LOC. Pt reports that she got woke up and walked to her door and passed out when she go tto the door. Pt reports she thinks she stood up to fast. Pt fell onto hardwood floor. C/o R elbow pain.

## 2022-08-12 ENCOUNTER — Emergency Department (HOSPITAL_BASED_OUTPATIENT_CLINIC_OR_DEPARTMENT_OTHER): Payer: BLUE CROSS/BLUE SHIELD

## 2022-08-12 ENCOUNTER — Emergency Department (HOSPITAL_BASED_OUTPATIENT_CLINIC_OR_DEPARTMENT_OTHER)
Admission: EM | Admit: 2022-08-12 | Discharge: 2022-08-12 | Disposition: A | Discharge status: Home / Self Care | Payer: BLUE CROSS/BLUE SHIELD | Attending: Emergency Medicine | Admitting: Emergency Medicine

## 2022-08-12 DIAGNOSIS — R55 Syncope and collapse: Secondary | ICD-10-CM

## 2022-08-12 LAB — URINALYSIS, ROUTINE W REFLEX MICROSCOPIC
Bacteria, UA: NONE SEEN
Bilirubin Urine: NEGATIVE
Glucose, UA: NEGATIVE mg/dL
Ketones, ur: NEGATIVE mg/dL
Leukocytes,Ua: NEGATIVE
Nitrite: NEGATIVE
Protein, ur: NEGATIVE mg/dL
Specific Gravity, Urine: 1.02 (ref 1.005–1.030)
pH: 7 (ref 5.0–8.0)

## 2022-08-12 LAB — PREGNANCY, URINE: Preg Test, Ur: NEGATIVE

## 2022-08-12 NOTE — Discharge Instructions (Signed)
Have someone stay with you until you feel stable. Do not drive, operate machinery, or play sports until your caregiver says it is okay. Keep all follow-up appointments as directed by your caregiver. Lie down right away if you start feeling like you might faint. Breathe deeply and steadily. Wait until all the symptoms have passed.Drink enough fluids to keep your urine clear or pale yellow.   SEEK IMMEDIATE MEDICAL CARE IF: You have a severe headache. You have unusual pain in the chest, abdomen, or back. You are bleeding from the mouth or rectum, or you have a black or tarry stool. You have an irregular or very fast heartbeat. You have pain with breathing. You have repeated fainting or seizure-like jerking during an episode. You faint when sitting or lying down. You have confusion. You have difficulty walking. You have severe weakness. You have vision problems. If you fainted, call your local emergency services - do not drive yourself to the hospital.   Please return to the emergency department immediately for any new or concerning symptoms, or if you get worse. 

## 2022-08-12 NOTE — ED Provider Notes (Signed)
[place] EMERGENCY DEPARTMENT AT [facility] Provider Note  CSN: [id number] Arrival date & time: 08/11/22 2238  Chief Complaint(s) Loss of Consciousness  HPI Angelica Griffin is a 27 y.o. female with past medical history as below, significant for asthma, obesity who presents to the ED with complaint of syncope.  Patient reports around 9:00 last night she was suddenly awakened by family ember knocking on her door while she was asleep.  She jumped out of bed and then had brief LOC.  Sister witnessed the episode and did not observe any seizure-like activity.  Was not postictal following the episode.  She feels back to baseline this time.  Similar episode in the past associate with similar circumstances.  Patient denies headache, nausea, vomiting, chest pain or dyspnea, no palpitations.  She is feeling at her typical baseline at this time.  No numbness or tingling to extremities.  Denies medication or diet changes.  No recent alcohol ingestion.  Reports she fell onto her right elbow, has some pain to right elbow but no limitation to range of motion  Past Medical History Past Medical History:  Diagnosis Date   Asthma    GOITER NOS 04/26/2006   Qualifier: Diagnosis of  By: Bradly Bienenstock     Obesity    Patient Active Problem List   Diagnosis Date Noted   Bacterial vaginosis 11/20/2019   Vaginal discharge 04/17/2018   Infertility, female 02/18/2018   STD exposure 02/18/2018   Routine Papanicolaou smear 02/08/2018   Allergic rhinitis 07/11/2013   ASTHMA, INTERMITTENT 10/30/2007   OBESITY, NOS 04/26/2006   Home Medication(s) Prior to Admission medications   Medication Sig Start Date End Date Taking? Authorizing Provider  albuterol (PROAIR HFA) 108 (90 BASE) MCG/ACT inhaler INHALE 2 PUFFS INTO THE LUNGS EVERY 6 (SIX) HOURS AS NEEDED FOR WHEEZING. 11/10/14   Melancon, Hillery Hunter, MD  albuterol (PROVENTIL) (2.5 MG/3ML) 0.083% nebulizer solution Take 3 mLs (2.5 mg total) by  nebulization every 6 (six) hours as needed for wheezing or shortness of breath. 02/08/22   Koren Bound                                                                                                                                    Past Surgical History History reviewed. No pertinent surgical history. Family History Family History  Problem Relation Age of Onset   Diabetes Maternal Grandmother    Hypertension Maternal Grandmother     Social History Social History   Tobacco Use   Smoking status: Former    Types: Cigars    Quit date: 02/28/2011    Years since quitting: 11.4   Smokeless tobacco: Never  Vaping Use   Vaping Use: Never used  Substance Use Topics   Alcohol use: No   Drug use: Yes    Types: Marijuana   Allergies Latex and Chlorine  Review of Systems Review of Systems  Constitutional:  Negative for activity change and fever.  HENT:  Negative for facial swelling and trouble swallowing.   Eyes:  Negative for discharge and redness.  Respiratory:  Negative for cough and shortness of breath.   Cardiovascular:  Negative for chest pain and palpitations.  Gastrointestinal:  Negative for abdominal pain and nausea.  Genitourinary:  Negative for dysuria and flank pain.  Musculoskeletal:  Negative for back pain and gait problem.  Skin:  Negative for pallor and rash.  Neurological:  Positive for syncope. Negative for headaches.    Physical Exam Vital Signs  I have reviewed the triage vital signs BP 133/87   Pulse 63   Temp 97.6 F (36.4 C) (Oral)   Resp 18   SpO2 96%  Physical Exam Vitals and nursing note reviewed.  Constitutional:      General: She is not in acute distress.    Appearance: Normal appearance. She is obese. She is not ill-appearing.  HENT:     Head: Normocephalic and atraumatic. No raccoon eyes, Battle's sign, right periorbital erythema or left periorbital erythema.     Jaw: There is normal jaw occlusion.     Comments: No external  evidence of head trauma    Right Ear: External ear normal.     Left Ear: External ear normal.     Nose: Nose normal.     Mouth/Throat:     Mouth: Mucous membranes are moist.  Eyes:     General: No scleral icterus.       Right eye: No discharge.        Left eye: No discharge.     Extraocular Movements: Extraocular movements intact.     Pupils: Pupils are equal, round, and reactive to light.  Cardiovascular:     Rate and Rhythm: Normal rate and regular rhythm.     Pulses: Normal pulses.     Heart sounds: Normal heart sounds.  Pulmonary:     Effort: Pulmonary effort is normal. No respiratory distress.     Breath sounds: Normal breath sounds.  Abdominal:     General: Abdomen is flat.     Tenderness: There is no abdominal tenderness.  Musculoskeletal:        General: Normal range of motion.     Cervical back: No rigidity.     Right lower leg: No edema.     Left lower leg: No edema.  Skin:    General: Skin is warm and dry.     Capillary Refill: Capillary refill takes less than 2 seconds.  Neurological:     Mental Status: She is alert and oriented to person, place, and time.     GCS: GCS eye subscore is 4. GCS verbal subscore is 5. GCS motor subscore is 6.     Cranial Nerves: Cranial nerves 2-12 are intact.     Sensory: Sensation is intact.     Motor: Motor function is intact.     Coordination: Coordination is intact.     Gait: Gait is intact.     Comments: Strength 5/5 bilateral lower extremities   Psychiatric:        Mood and Affect: Mood normal.        Behavior: Behavior normal.     ED Results and Treatments Labs (all labs ordered are listed, but only abnormal results are displayed) Labs Reviewed  URINALYSIS, ROUTINE W REFLEX MICROSCOPIC - Abnormal; Notable for the following components:      Result Value   Hgb urine dipstick SMALL (*)  All other components within normal limits  PREGNANCY, URINE  CBG MONITORING, ED                                                                                                                           Radiology DG Elbow Complete Right  Result Date: 08/12/2022 CLINICAL DATA:  Fall and trauma to the right elbow. EXAM: RIGHT ELBOW - COMPLETE 3+ VIEW COMPARISON:  None Available. FINDINGS: There is no evidence of fracture, dislocation, or joint effusion. There is no evidence of arthropathy or other focal bone abnormality. Soft tissues are unremarkable. IMPRESSION: Negative. Electronically Signed   By: Elgie Collard M.D.   On: 08/12/2022 00:22    Pertinent labs & imaging results that were available during my care of the patient were reviewed by me and considered in my medical decision making (see MDM for details).  Medications Ordered in ED Medications - No data to display                                                                                                                                   Procedures Procedures  (including critical care time)  Medical Decision Making / ED Course    Medical Decision Making:    Angelica Griffin is a 27 y.o. female with past medical history as below, significant for asthma, obesity who presents to the ED with complaint of syncope.. The complaint involves an extensive differential diagnosis and also carries with it a high risk of complications and morbidity.  Serious etiology was considered. Ddx includes but is not limited to: Vasovagal syncope, simple syncope, cardiac syncope, seizure, etc.  Complete initial physical exam performed, notably the patient  was neuro intact, no acute distress, HDS.    Reviewed and confirmed nursing documentation for past medical history, family history, social history.  Vital signs reviewed.      Pregnancy negative, CBG 99, urinalysis without evidence of infection, right elbow x-ray unremarkable no fracture  No external signs of trauma to right elbow.  Right elbow x-ray was negative.  Upper extremities are NVI  EKG without  arrhythmia  [facility] syncope score is low  Patient presents with syncopal symptoms without worrisome features. Presentation most suggestive of neuro-cardiogenic or orthostatic cause. Very low suspicion for serious arrhythmia, cardiac ischemia or other serious etiology. ECG reviewed, no evidence of a cardiac arrhythmia such as Brugada, WPW, HOCM,  IHSS, Long or short QT. Neurologic exam is nonfocal, not consistent with CVA or primary neurologic abnormality. Patient appears safe for discharge with outpatient observation and close PCP F/U. Syncope warnings discussed with patient. The patient has been instructed to return immediately if the symptoms worsen in any way. Patient verbalized understanding and is in agreement with current care plan. All questions answered prior to discharge        Additional history obtained: -Additional history obtained from family -External records from outside source obtained and reviewed including: Chart review including previous notes, labs, imaging, consultation notes including prior urgent care documentation, home medications, prior labs and imaging   Lab Tests: -I ordered, reviewed, and interpreted labs.   The pertinent results include:   Labs Reviewed  URINALYSIS, ROUTINE W REFLEX MICROSCOPIC - Abnormal; Notable for the following components:      Result Value   Hgb urine dipstick SMALL (*)    All other components within normal limits  PREGNANCY, URINE  CBG MONITORING, ED    Notable for pregnancy negative  EKG   EKG Interpretation  Date/Time:  Friday August 11 2022 22:54:03 EDT Ventricular Rate:  73 PR Interval:  188 QRS Duration: 88 QT Interval:  374 QTC Calculation: 412 R Axis:   57 Text Interpretation: Normal sinus rhythm with sinus arrhythmia Normal ECG When compared with ECG of 21-Jan-2017 12:44, PREVIOUS ECG IS PRESENT similar to prior Confirmed by Tanda Rockers ([id number]) on 08/12/2022 2:56:28 AM         Imaging Studies ordered: I  ordered imaging studies including x-ray I independently visualized the following imaging with scope of interpretation limited to determining acute life threatening conditions related to emergency care; findings noted above, significant for no obvious fracture I independently visualized and interpreted imaging. I agree with the radiologist interpretation   Medicines ordered and prescription drug management: No orders of the defined types were placed in this encounter.   -I have reviewed the patients home medicines and have made adjustments as needed   Consultations Obtained: na   Cardiac Monitoring: The patient was maintained on a cardiac monitor.  I personally viewed and interpreted the cardiac monitored which showed an underlying rhythm of: NSR  Social Determinants of Health:  Diagnosis or treatment significantly limited by social determinants of health: former smoker and obesity   Reevaluation: After the interventions noted above, I reevaluated the patient and found that they have resolved  Co morbidities that complicate the patient evaluation  Past Medical History:  Diagnosis Date   Asthma    GOITER NOS 04/26/2006   Qualifier: Diagnosis of  By: Bradly Bienenstock     Obesity       Dispostion: Disposition decision including need for hospitalization was considered, and patient discharged from emergency department.    Final Clinical Impression(s) / ED Diagnoses Final diagnoses:  None     This chart was dictated using voice recognition software.  Despite best efforts to proofread,  errors can occur which can change the documentation meaning.    Sloan Leiter, DO 08/12/22 [phone]

## 2022-08-15 ENCOUNTER — Ambulatory Visit (INDEPENDENT_AMBULATORY_CARE_PROVIDER_SITE_OTHER): Payer: BLUE CROSS/BLUE SHIELD | Admitting: Family Medicine

## 2022-08-15 ENCOUNTER — Encounter: Payer: Self-pay | Admitting: Family Medicine

## 2022-08-15 VITALS — BP 129/79 | HR 63 | Wt 226.8 lb

## 2022-08-15 DIAGNOSIS — R55 Syncope and collapse: Secondary | ICD-10-CM | POA: Diagnosis not present

## 2022-08-15 NOTE — Progress Notes (Unsigned)
    SUBJECTIVE:   CHIEF COMPLAINT / HPI:   ER follow-up Was seen in the ED on 08/12/22 after syncopal episode. EKG unremarkable. Thought to be orthostatic or vagal mediated.  Was sleeping. Mom knocked on door so patient jumped up, let her Mom in, then woke up on the floor Felt like her heart was racing, lightheaded, beforehand-- went to sit down but didn't make it Happened once before, exact same situation in 2018-- was super hot, jumped up.  Now feeling her usual self  PERTINENT  PMH / PSH: ***  OBJECTIVE:   BP 129/79   Pulse 63   Wt 226 lb 12.8 oz (102.9 kg)   SpO2 100%   BMI 40.18 kg/m   *** Orthostatic Vitals for the past 48 hrs (Last 6 readings):  BP Pulse BP- Standing at 0 minutes Pulse- Standing at 0 minutes BP- Sitting Pulse- Sitting BP- Lying Pulse- Lying  08/15/22 1017 129/79 63 -- -- -- -- -- --  08/15/22 1052 -- -- 127/84 60 131/80 60 127/80 62     ASSESSMENT/PLAN:   No problem-specific Assessment & Plan notes found for this encounter.     Maury Dus, MD [facility]

## 2022-08-15 NOTE — Patient Instructions (Addendum)
It was great to see you!  We will check some basic blood work today.  I will send you a MyChart message with the results or call if needed.  When changing positions, specifically from lying/sitting to standing, take your time.  -Dr Anner Crete  Syncope, Adult  Syncope refers to a condition in which a person temporarily loses consciousness. Syncope may also be called fainting or passing out. It is caused by a sudden decrease in blood flow to the brain. This can happen for a variety of reasons. Most causes of syncope are not dangerous. It can be triggered by things such as needle sticks, seeing blood, pain, or intense emotion. However, syncope can also be a sign of a serious medical problem, such as a heart abnormality. Other causes can include dehydration, migraines, or taking medicines that lower blood pressure. Your health care provider may do tests to find the reason why you are having syncope. If you faint, get medical help right away. Call your local emergency services (911 in the [place] Follow these instructions at home: Pay attention to any changes in your symptoms. Take these actions to stay safe and to help relieve your symptoms: Knowing when you may be about to faint Signs that you may be about to faint include: Feeling dizzy, weak, light-headed, or like the room is spinning. Feeling nauseous. Seeing spots or seeing all white or all black in your field of vision. Having cold, clammy skin or feeling warm and sweaty. Hearing ringing in the ears (tinnitus). If you start to feel like you might faint, sit or lie down right away. If sitting, put your head down between your legs. If lying down, raise (elevate) your feet above the level of your heart. Breathe deeply and steadily. Wait until all the symptoms have passed. Have someone stay with you until you feel stable. Medicines Take over-the-counter and prescription medicines only as told by your health care provider. If you are taking blood  pressure or heart medicine, get up slowly and take several minutes to sit and then stand. This can reduce dizziness and decrease the risk of syncope. Lifestyle Do not drive, use machinery, or play sports until your health care provider says it is okay. Do not drink alcohol. Do not use any products that contain nicotine or tobacco. These products include cigarettes, chewing tobacco, and vaping devices, such as e-cigarettes. If you need help quitting, ask your health care provider. Avoid hot tubs and saunas. General instructions Talk with your health care provider about your symptoms. You may need to have testing to understand the cause of your syncope. Drink enough fluid to keep your urine pale yellow. Avoid prolonged standing. If you must stand for a long time, do movements such as: Moving your legs. Crossing your legs. Flexing and stretching your leg muscles. Squatting. Keep all follow-up visits. This is important. Contact a health care provider if: You have episodes of near fainting. Get help right away if: You faint. You hit your head or are injured after fainting. You have any of these symptoms that may indicate trouble with your heart: Fast or irregular heartbeats (palpitations). Unusual pain in your chest, abdomen, or back. Shortness of breath. You have a seizure. You have a severe headache. You are confused. You have vision problems. You have severe weakness or trouble walking. You are bleeding from your mouth or rectum, or you have black or tarry stool. These symptoms may represent a serious problem that is an emergency. Do not wait to  see if your symptoms will go away. Get medical help right away. Call your local emergency services (911 in the [place] Do not drive yourself to the hospital. Summary Syncope refers to a condition in which a person temporarily loses consciousness. Syncope may also be called fainting or passing out. It is caused by a sudden decrease in blood flow  to the brain. Signs that you may be about to faint include dizziness, feeling light-headed, feeling nauseous, sudden vision changes, or cold, clammy skin. Even though most causes of syncope are not dangerous, syncope can be a sign of a serious medical problem. Get help right away if you faint. If you start to feel like you might faint, sit or lie down right away. If sitting, put your head down between your legs. If lying down, raise (elevate) your feet above the level of your heart. This information is not intended to replace advice given to you by your health care provider. Make sure you discuss any questions you have with your health care provider. Document Revised: 06/24/2020 Document Reviewed: 06/24/2020 Elsevier Patient Education  2024 [employer].

## 2022-08-16 DIAGNOSIS — R55 Syncope and collapse: Secondary | ICD-10-CM | POA: Insufficient documentation

## 2022-08-16 LAB — BASIC METABOLIC PANEL
BUN/Creatinine Ratio: 12 (ref 9–23)
BUN: 10 mg/dL (ref 6–20)
CO2: 26 mmol/L (ref 20–29)
Calcium: 9.4 mg/dL (ref 8.7–10.2)
Chloride: 105 mmol/L (ref 96–106)
Creatinine, Ser: 0.86 mg/dL (ref 0.57–1.00)
Glucose: 79 mg/dL (ref 70–99)
Potassium: 4.4 mmol/L (ref 3.5–5.2)
Sodium: 143 mmol/L (ref 134–144)
eGFR: 94 mL/min/{1.73_m2} (ref >59)

## 2022-08-16 LAB — CBC
Hematocrit: 39.2 % (ref 34.0–46.6)
Hemoglobin: 12.7 g/dL (ref 11.1–15.9)
MCH: 27.3 pg (ref 26.6–33.0)
MCHC: 32.4 g/dL (ref 31.5–35.7)
MCV: 84 fL (ref 79–97)
Platelets: 295 10*3/uL (ref 150–450)
RBC: 4.66 x10E6/uL (ref 3.77–5.28)
RDW: 12 % (ref 11.7–15.4)
WBC: 5.2 10*3/uL (ref 3.4–10.8)

## 2022-08-16 NOTE — Assessment & Plan Note (Addendum)
Patient with isolated syncopal episode on 6/15.  Likely orthostatic or vagal mediated.  No red flags.  No concern for seizure activity.  EKG unremarkable in the ED. UPreg negative in the ED.  Will check CBC, BMP today to rule out anemia or electrolyte derangement.  Reassurance provided.  Return if recurrent.

## 2022-09-05 ENCOUNTER — Ambulatory Visit (INDEPENDENT_AMBULATORY_CARE_PROVIDER_SITE_OTHER): Payer: BLUE CROSS/BLUE SHIELD | Admitting: Student

## 2022-09-05 ENCOUNTER — Encounter: Payer: Self-pay | Admitting: Student

## 2022-09-05 ENCOUNTER — Other Ambulatory Visit (HOSPITAL_COMMUNITY)
Admission: RE | Admit: 2022-09-05 | Discharge: 2022-09-05 | Disposition: A | Discharge status: Home / Self Care | Payer: BLUE CROSS/BLUE SHIELD | Source: Ambulatory Visit | Attending: Family Medicine | Admitting: Family Medicine

## 2022-09-05 VITALS — BP 126/70 | HR 69 | Ht 63.0 in | Wt 231.4 lb

## 2022-09-05 DIAGNOSIS — Z131 Encounter for screening for diabetes mellitus: Secondary | ICD-10-CM | POA: Diagnosis not present

## 2022-09-05 DIAGNOSIS — Z113 Encounter for screening for infections with a predominantly sexual mode of transmission: Secondary | ICD-10-CM

## 2022-09-05 DIAGNOSIS — E282 Polycystic ovarian syndrome: Secondary | ICD-10-CM | POA: Diagnosis not present

## 2022-09-05 DIAGNOSIS — Z124 Encounter for screening for malignant neoplasm of cervix: Secondary | ICD-10-CM | POA: Diagnosis not present

## 2022-09-05 DIAGNOSIS — N979 Female infertility, unspecified: Secondary | ICD-10-CM

## 2022-09-05 MED ORDER — METFORMIN HCL ER 500 MG PO TB24
500.0000 mg | ORAL_TABLET | Freq: Every day | ORAL | 0 refills | Status: DC
Start: 2022-09-05 — End: 2022-12-04

## 2022-09-05 MED ORDER — ALBUTEROL SULFATE HFA 108 (90 BASE) MCG/ACT IN AERS: INHALATION_SPRAY | RESPIRATORY_TRACT | 0 refills | Status: DC

## 2022-09-05 NOTE — Patient Instructions (Addendum)
It was great to see you today!   Today we addressed: I have sent a refill for your albuterol inhaler  I have restarted metformin to help treat possible PCOS - you should start taking 500 mg daily - if you begin to have GI upset please call and let me know You should start taking a prenatal vitamin.  You can find ovulation tests online to help track your cycle.  Please come back to see me in 3-6 months to follow up on how things are going.    No future appointments.  Please arrive 15 minutes before your appointment to ensure smooth check in process.    Please call the clinic at [phone] if your symptoms worsen or you have any concerns.  Thank you for allowing me to participate in your care, Dr. Glendale Chard [facility]

## 2022-09-05 NOTE — Progress Notes (Signed)
    SUBJECTIVE:   CHIEF COMPLAINT / HPI:   Angelica Griffin is a 27 y.o. female  presenting for annual wellness visit. She is due for a pap smear and would like to be screened for STDs.  She would also like to discuss previous diagnosis of PCOS.  Back in 2019 she was started on metformin 500 mg daily and she would like to restart that treatment.  She is also wanting to plan a pregnancy.  She is not on prenatal vitamins.  She reports having irregular periods with cycle days ranging from 30-40.  She reports having ~9 periods every year.  She denies severe cramping or bleeding during menstruation.  PERTINENT  PMH / PSH: Reviewed and updated   OBJECTIVE:   BP 126/70   Pulse 69   Ht 5\' 3"  (1.6 m)   Wt 231 lb 6 oz (105 kg)   LMP 08/12/2022   SpO2 100%   BMI 40.99 kg/m   Well-appearing, no acute distress Cardio: Regular rate, regular rhythm, no murmurs on exam. Pulm: Clear, no wheezing, no crackles. No increased work of breathing Abdominal: bowel sounds present, soft, non-tender, non-distended Extremities: no peripheral edema  Neuro: alert and oriented x3, speech normal in content, no facial asymmetry, strength intact and equal bilaterally in UE and LE, pupils equal and reactive to light.  Psych:  Cognition and judgment appear intact. Alert, communicative  and cooperative with normal attention span and concentration. No apparent delusions, illusions, hallucinations   Pelvic Exam: MA chaperone present  Normal external genitalia No abnormal discharge  No cervical motion tenderness  Cervix visualized with no lesions       09/05/2022    2:00 PM 11/17/2019    2:11 PM 04/17/2018   11:10 AM  PHQ9 SCORE ONLY  PHQ-9 Total Score 0 0 0      ASSESSMENT/PLAN:   Infertility, female Instructed patient to restart prenatal vitamins.  Also prescribed metformin 500 mg for PCOS.  Patient to follow-up in 3 to 6 months.  Screening for STD (sexually transmitted disease) Patient would like to  be tested for GC, trichomoniasis, HIV, RPR.  Labs sent and will relay results via MyChart   Health maintenance: Completed Pap smear today  Glendale Chard, DO [facility]

## 2022-09-05 NOTE — Assessment & Plan Note (Signed)
Patient would like to be tested for GC, trichomoniasis, HIV, RPR.  Labs sent and will relay results via MyChart

## 2022-09-05 NOTE — Assessment & Plan Note (Signed)
Instructed patient to restart prenatal vitamins.  Also prescribed metformin 500 mg for PCOS.  Patient to follow-up in 3 to 6 months.

## 2022-09-06 LAB — CYTOLOGY - PAP
Adequacy: ABSENT
Chlamydia: NEGATIVE
Comment: NEGATIVE
Comment: NEGATIVE
Comment: NORMAL
Diagnosis: NEGATIVE
Neisseria Gonorrhea: NEGATIVE
Trichomonas: NEGATIVE

## 2022-09-09 LAB — HEMOGLOBIN A1C
Est. average glucose Bld gHb Est-mCnc: 114 mg/dL
Hgb A1c MFr Bld: 5.6 % (ref 4.8–5.6)

## 2022-09-09 LAB — RPR: RPR Ser Ql: NONREACTIVE

## 2022-09-09 LAB — HIV-1/HIV-2 QUALITATIVE RNA
Final Interpretation: NEGATIVE
HIV-1 RNA, Qualitative: NONREACTIVE
HIV-2 RNA, Qualitative: NONREACTIVE

## 2022-09-09 LAB — HIV 1/2 AB DIFFERENTIATION
HIV 1 Ab: NONREACTIVE
HIV 2 Ab: NONREACTIVE
NOTE (HIV CONF MULTIP: NEGATIVE

## 2022-09-09 LAB — HIV ANTIBODY (ROUTINE TESTING W REFLEX): HIV Screen 4th Generation wRfx: REACTIVE

## 2022-09-30 ENCOUNTER — Other Ambulatory Visit: Payer: Self-pay | Admitting: Student

## 2022-10-07 ENCOUNTER — Emergency Department (HOSPITAL_BASED_OUTPATIENT_CLINIC_OR_DEPARTMENT_OTHER)
Admission: EM | Admit: 2022-10-07 | Discharge: 2022-10-08 | Disposition: A | Discharge status: Home / Self Care | Payer: BLUE CROSS/BLUE SHIELD | Attending: Emergency Medicine | Admitting: Emergency Medicine

## 2022-10-07 ENCOUNTER — Other Ambulatory Visit: Payer: Self-pay

## 2022-10-07 ENCOUNTER — Encounter (HOSPITAL_BASED_OUTPATIENT_CLINIC_OR_DEPARTMENT_OTHER): Payer: Self-pay | Admitting: Emergency Medicine

## 2022-10-07 DIAGNOSIS — Z1152 Encounter for screening for COVID-19: Secondary | ICD-10-CM | POA: Insufficient documentation

## 2022-10-07 DIAGNOSIS — J020 Streptococcal pharyngitis: Secondary | ICD-10-CM | POA: Insufficient documentation

## 2022-10-07 DIAGNOSIS — J029 Acute pharyngitis, unspecified: Secondary | ICD-10-CM | POA: Diagnosis present

## 2022-10-07 MED ORDER — ACETAMINOPHEN 325 MG PO TABS
650.0000 mg | ORAL_TABLET | Freq: Once | ORAL | Status: AC | PRN
  Administered 2022-10-07: 650 mg via ORAL
  Filled 2022-10-07: qty 2

## 2022-10-07 NOTE — ED Triage Notes (Addendum)
Pt in with generalized aches, sore throat and fever. Symptoms began Thursday, and pt states she also wants a pregnancy test. Pt also states she had emesis x 1 Friday. Denies any cough or sob. Temp 101.5, last Tylenol yesterday.

## 2022-10-08 LAB — SARS CORONAVIRUS 2 BY RT PCR: SARS Coronavirus 2 by RT PCR: NEGATIVE

## 2022-10-08 LAB — GROUP A STREP BY PCR: Group A Strep by PCR: DETECTED — AB

## 2022-10-08 LAB — PREGNANCY, URINE: Preg Test, Ur: NEGATIVE

## 2022-10-08 MED ORDER — IBUPROFEN 800 MG PO TABS
800.0000 mg | ORAL_TABLET | Freq: Once | ORAL | Status: AC
  Administered 2022-10-08: 800 mg via ORAL
  Filled 2022-10-08: qty 1

## 2022-10-08 MED ORDER — PENICILLIN G BENZATHINE 1200000 UNIT/2ML IM SUSY
1.2000 10*6.[IU] | PREFILLED_SYRINGE | Freq: Once | INTRAMUSCULAR | Status: AC
  Administered 2022-10-08: 1.2 10*6.[IU] via INTRAMUSCULAR
  Filled 2022-10-08: qty 2

## 2022-10-08 NOTE — Discharge Instructions (Signed)
You were treated for strep throat today with a shot of penicillin.  You do not need further antibiotics.  Use Tylenol or Motrin as needed for aches and fever.  You may alternate one of the other every 3 hours.  Follow-up with your doctor.  Return to the ED with difficulty breathing difficulty swallowing, chest pain, shortness of breath or other concerns.

## 2022-10-08 NOTE — ED Notes (Signed)
Reviewed AVS with patient, patient expressed understanding of directions, denies further questions at this time. 

## 2022-10-08 NOTE — ED Provider Notes (Signed)
[employer] EMERGENCY DEPARTMENT AT [facility] Provider Note   CSN: [id number] Arrival date & time: 10/07/22  2317     History  Chief Complaint  Patient presents with   Fever   Nasal Congestion    Angelica Griffin is a 27 y.o. female.  Patient reports feeling ill for the past 3 days.  Started as headache with bodyaches and sore throat.  Progressed to nausea.  Has felt warm at home but not checked her temperature.  101.5 on arrival to the ED.  Did not receive any antipyretics today.  Complains of headache and sore throat but no significant cough or congestion.  No runny nose.  Does have COVID exposure at work.  Headache is diffuse and gradual in onset.  Feels sore and achy all over.  No significant ear pain.  Has throat pain worse in the middle pain with swallowing and swollen lymph nodes in her neck.  No chest pain, shortness of breath, cough, abdominal pain, vomiting.  No pain with urination or blood in the urine.  No travel.  The history is provided by the patient.  Fever Associated symptoms: headaches, myalgias and sore throat   Associated symptoms: no chest pain, no congestion, no cough, no dysuria, no nausea and no vomiting        Home Medications Prior to Admission medications   Medication Sig Start Date End Date Taking? Authorizing Provider  albuterol (VENTOLIN HFA) 108 (90 Base) MCG/ACT inhaler INHALE 2 PUFFS INTO THE LUNGS EVERY 6 HOURS AS NEEDED FOR WHEEZE 10/02/22   Glendale Chard, DO  metFORMIN (GLUCOPHAGE-XR) 500 MG 24 hr tablet Take 1 tablet (500 mg total) by mouth daily. 09/05/22   Glendale Chard, DO      Allergies    Latex and Chlorine    Review of Systems   Review of Systems  Constitutional:  Positive for activity change, appetite change, fatigue and fever.  HENT:  Positive for sore throat. Negative for congestion.   Respiratory:  Negative for cough.   Cardiovascular:  Negative for chest pain.  Gastrointestinal:  Negative for abdominal pain, nausea  and vomiting.  Genitourinary:  Negative for dysuria and hematuria.  Musculoskeletal:  Positive for arthralgias and myalgias. Negative for neck pain.  Skin:  Negative for wound.  Neurological:  Positive for weakness and headaches. Negative for dizziness.   all other systems are negative except as noted in the HPI and PMH.    Physical Exam Updated Vital Signs BP (!) 143/72   Pulse 88   Temp (!) 101.5 F (38.6 C) (Oral)   Resp 20   Wt 99.8 kg   LMP 08/02/2022 (Approximate)   SpO2 99%   BMI 38.97 kg/m  Physical Exam Vitals and nursing note reviewed.  Constitutional:      General: She is not in acute distress.    Appearance: She is well-developed.  HENT:     Head: Normocephalic and atraumatic.     Right Ear: Tympanic membrane normal.     Left Ear: Tympanic membrane normal.     Mouth/Throat:     Mouth: Mucous membranes are moist.     Pharynx: Oropharyngeal exudate and posterior oropharyngeal erythema present.     Comments: Inflamed tonsils with exudates bilaterally.  Uvula midline.  Controlling secretions Eyes:     Conjunctiva/sclera: Conjunctivae normal.     Pupils: Pupils are equal, round, and reactive to light.  Neck:     Comments: No meningismus. Cardiovascular:     Rate and  Rhythm: Normal rate and regular rhythm.     Heart sounds: Normal heart sounds. No murmur heard. Pulmonary:     Effort: Pulmonary effort is normal. No respiratory distress.     Breath sounds: Normal breath sounds.  Abdominal:     Palpations: Abdomen is soft.     Tenderness: There is no abdominal tenderness. There is no guarding or rebound.  Musculoskeletal:        General: No tenderness. Normal range of motion.     Cervical back: Normal range of motion and neck supple.  Lymphadenopathy:     Cervical: Cervical adenopathy present.  Skin:    General: Skin is warm.  Neurological:     Mental Status: She is alert and oriented to person, place, and time.     Cranial Nerves: No cranial nerve deficit.      Motor: No abnormal muscle tone.     Coordination: Coordination normal.     Comments:  5/5 strength throughout. CN 2-12 intact.Equal grip strength.   Psychiatric:        Behavior: Behavior normal.     ED Results / Procedures / Treatments   Labs (all labs ordered are listed, but only abnormal results are displayed) Labs Reviewed  GROUP A STREP BY PCR - Abnormal; Notable for the following components:      Result Value   Group A Strep by PCR DETECTED (*)    All other components within normal limits  SARS CORONAVIRUS 2 BY RT PCR  PREGNANCY, URINE    EKG None  Radiology No results found.  Procedures Procedures    Medications Ordered in ED Medications  acetaminophen (TYLENOL) tablet 650 mg (has no administration in time range)    ED Course/ Medical Decision Making/ A&P                                 Medical Decision Making Amount and/or Complexity of Data Reviewed Labs: ordered. Decision-making details documented in ED Course. Radiology: ordered and independent interpretation performed. Decision-making details documented in ED Course. ECG/medicine tests: ordered and independent interpretation performed. Decision-making details documented in ED Course.  Risk OTC drugs. Prescription drug management.   3 days of headache, sore throat, body aches and fever.  No significant cough or congestion.  Febrile on arrival.  Not hypoxic.  No meningismus  Patient given antipyretics.  She appears well and nontoxic.  Suspicion for possible strep versus URI versus COVID.  No coughing to suggest pneumonia.  Lungs are clear.  COVID test is negative, strep test is positive.  Patient agreeable to IM Bicillin.  Discussed p.o. hydration at home, antipyretics, PCP follow-up.  Return precautions discussed.        Final Clinical Impression(s) / ED Diagnoses Final diagnoses:  Strep pharyngitis    Rx / DC Orders ED Discharge Orders     None         Bentlee Benningfield, Jeannett Senior,  MD 10/08/22 [phone]

## 2022-10-25 ENCOUNTER — Ambulatory Visit (INDEPENDENT_AMBULATORY_CARE_PROVIDER_SITE_OTHER): Payer: BLUE CROSS/BLUE SHIELD | Admitting: Student

## 2022-10-25 ENCOUNTER — Other Ambulatory Visit (HOSPITAL_COMMUNITY)
Admission: RE | Admit: 2022-10-25 | Discharge: 2022-10-25 | Disposition: A | Discharge status: Home / Self Care | Payer: BLUE CROSS/BLUE SHIELD | Source: Ambulatory Visit | Attending: Family Medicine | Admitting: Family Medicine

## 2022-10-25 VITALS — BP 136/83 | HR 84 | Ht 63.0 in | Wt 218.4 lb

## 2022-10-25 DIAGNOSIS — Z113 Encounter for screening for infections with a predominantly sexual mode of transmission: Secondary | ICD-10-CM | POA: Diagnosis present

## 2022-10-25 DIAGNOSIS — B9689 Other specified bacterial agents as the cause of diseases classified elsewhere: Secondary | ICD-10-CM | POA: Diagnosis not present

## 2022-10-25 DIAGNOSIS — N760 Acute vaginitis: Secondary | ICD-10-CM | POA: Diagnosis not present

## 2022-10-25 LAB — POCT WET PREP (WET MOUNT)
Clue Cells Wet Prep Whiff POC: POSITIVE
Trichomonas Wet Prep HPF POC: ABSENT

## 2022-10-25 LAB — POCT URINE PREGNANCY: Preg Test, Ur: NEGATIVE

## 2022-10-25 MED ORDER — METRONIDAZOLE 500 MG PO TABS
500.0000 mg | ORAL_TABLET | Freq: Two times a day (BID) | ORAL | 0 refills | Status: DC
Start: 2022-10-25 — End: 2023-11-19

## 2022-10-25 NOTE — Patient Instructions (Addendum)
It was great seeing you today.  As we discussed, - We collected testing today. I will call if anything is abnormal.    If you have any questions or concerns, please feel free to call the clinic.   Have a wonderful day,  Dr. Darral Dash [facility] [phone]

## 2022-10-25 NOTE — Assessment & Plan Note (Signed)
Asymptomatic. Encouraged to use condoms or other barrier methods to prevent sexually transmitted infections. Collected testing for GC, trichomonas, HIV and RPR Labs collected, will relay results via telephone call if abnormal, or MyChart if normal.

## 2022-10-25 NOTE — Progress Notes (Signed)
    SUBJECTIVE:   CHIEF COMPLAINT / HPI:   Angelica Griffin is here for STI screening and urine pregnancy test.  She notes she does not always have regular periods.  Not currently taking any contraception and has no desire to start any.  She says if she gets pregnant, she be okay with that. Her LMP was 10/12/2022-10/18/2022.  It was regular in duration and severity of bleeding.  However, she did have cramping that she does not usually get.  She is sexually active with men only.  She denies any vaginal itching, burning, change in discharge or odor. No dysuria or hematuria.  She is up-to-date on her Pap smear.  PERTINENT  PMH / PSH: History of BV,  OBJECTIVE:   BP 136/83   Pulse 84   Ht 5\' 3"  (1.6 m)   Wt 218 lb 6.4 oz (99.1 kg)   LMP 10/12/2022 (Approximate)   SpO2 100%   BMI 38.69 kg/m  General: Pleasant and well-appearing Respiratory: Breathes normally on room air without any labored breathing GU: Cleatrice Burke, CMA present as chaperone for exam. External vulva and vagina nonerythematous, without any obvious lesions or rash.  Mild amount of blood in the posterior fornix, with small clots.  Normal ruggae of vaginal walls.  Cervical os is closed.    ASSESSMENT/PLAN:   Screening for STD (sexually transmitted disease) Asymptomatic. Encouraged to use condoms or other barrier methods to prevent sexually transmitted infections. Collected testing for GC, trichomonas, HIV and RPR Labs collected, will relay results via telephone call if abnormal, or MyChart if normal.  Bacterial vaginosis POC wet prep positive for clue cells Plan to treat with metronidazole twice daily for 7 days   Per patient request, collected urine pregnancy today although she recently had normal menses.  However, having some spotting/bleeding today. No concern today for ectopic pregnancy.  She is hemodynamically stable. Will call with results.  Discussed that if it anytime she desires contraception,  she may return for a visit.  However, she reports that she would not be upset if she got pregnant.  Darral Dash, DO [facility]

## 2022-10-25 NOTE — Assessment & Plan Note (Signed)
POC wet prep positive for clue cells Plan to treat with metronidazole twice daily for 7 days

## 2022-10-26 LAB — HIV ANTIBODY (ROUTINE TESTING W REFLEX): HIV Screen 4th Generation wRfx: NONREACTIVE

## 2022-10-26 LAB — RPR: RPR Ser Ql: NONREACTIVE

## 2022-11-02 LAB — CERVICOVAGINAL ANCILLARY ONLY
Chlamydia: NEGATIVE
Comment: NEGATIVE
Comment: NEGATIVE
Comment: NORMAL
Neisseria Gonorrhea: NEGATIVE
Trichomonas: NEGATIVE

## 2022-12-04 ENCOUNTER — Other Ambulatory Visit: Payer: Self-pay | Admitting: Student

## 2022-12-04 DIAGNOSIS — E282 Polycystic ovarian syndrome: Secondary | ICD-10-CM

## 2023-02-01 ENCOUNTER — Encounter (HOSPITAL_COMMUNITY): Payer: Self-pay | Admitting: Emergency Medicine

## 2023-02-01 ENCOUNTER — Ambulatory Visit (HOSPITAL_COMMUNITY)
Admission: EM | Admit: 2023-02-01 | Discharge: 2023-02-01 | Disposition: A | Discharge status: Home / Self Care | Payer: BLUE CROSS/BLUE SHIELD | Attending: Internal Medicine | Admitting: Internal Medicine

## 2023-02-01 ENCOUNTER — Other Ambulatory Visit: Payer: Self-pay

## 2023-02-01 DIAGNOSIS — J029 Acute pharyngitis, unspecified: Secondary | ICD-10-CM | POA: Insufficient documentation

## 2023-02-01 DIAGNOSIS — R051 Acute cough: Secondary | ICD-10-CM | POA: Insufficient documentation

## 2023-02-01 LAB — POCT RAPID STREP A (OFFICE): Rapid Strep A Screen: NEGATIVE

## 2023-02-01 MED ORDER — AMOXICILLIN 500 MG PO CAPS: 500.0000 mg | ORAL_CAPSULE | Freq: Two times a day (BID) | ORAL | 0 refills | Status: AC

## 2023-02-01 NOTE — ED Triage Notes (Signed)
Pt reports sore throat and cough x6 days. Some relief with chloraseptic spray and cough drops. Pt reports fevers at home and noticed white patches on throat last night. Pt thinks she may have strep and would like to be tested.

## 2023-02-01 NOTE — Discharge Instructions (Signed)
Your rapid strep is negative but given how your throat looks on exam, I am still going to treat you with antibiotic today.  Throat culture is pending.  Please follow-up if any symptoms persist or worsen.

## 2023-02-01 NOTE — ED Provider Notes (Signed)
[facility]    CSN: [id number] Arrival date & time: 02/01/23  0857      History   Chief Complaint Chief Complaint  Patient presents with   Sore Throat   Cough    HPI Angelica Griffin is a 27 y.o. female.   Patient presents with sore throat and cough that has been present for about 5 to 6 days.  She reports minimal nasal congestion.  Denies any associated fever.  She has used Chloraseptic spray with minimal improvement.  Denies any known sick contacts.  Denies any documented fevers but does report that she has felt feverish.  She is requesting strep testing today given that she noted white spots on her throat.   Sore Throat  Cough   Past Medical History:  Diagnosis Date   Asthma    GOITER NOS 04/26/2006   Qualifier: Diagnosis of  By: Bradly Bienenstock     Obesity     Patient Active Problem List   Diagnosis Date Noted   Syncope 08/16/2022   Bacterial vaginosis 11/20/2019   Vaginal discharge 04/17/2018   Infertility, female 02/18/2018   Screening for STD (sexually transmitted disease) 02/18/2018   Routine Papanicolaou smear 02/08/2018   Allergic rhinitis 07/11/2013   Asthma 10/30/2007   OBESITY, NOS 04/26/2006    History reviewed. No pertinent surgical history.  OB History   No obstetric history on file.      Home Medications    Prior to Admission medications   Medication Sig Start Date End Date Taking? Authorizing Provider  albuterol (VENTOLIN HFA) 108 (90 Base) MCG/ACT inhaler INHALE 2 PUFFS INTO THE LUNGS EVERY 6 HOURS AS NEEDED FOR WHEEZE 10/02/22  Yes Glendale Chard, DO  amoxicillin (AMOXIL) 500 MG capsule Take 1 capsule (500 mg total) by mouth 2 (two) times daily for 10 days. 02/01/23 02/11/23 Yes Curt Oatis, Acie Fredrickson, FNP  metFORMIN (GLUCOPHAGE-XR) 500 MG 24 hr tablet TAKE 1 TABLET (500 MG TOTAL) BY MOUTH DAILY. 12/04/22  Yes Glendale Chard, DO  metroNIDAZOLE (FLAGYL) 500 MG tablet Take 1 tablet (500 mg total) by mouth 2 (two) times daily. 10/25/22    Darral Dash, DO    Family History Family History  Problem Relation Age of Onset   Diabetes Maternal Grandmother    Hypertension Maternal Grandmother     Social History Social History   Tobacco Use   Smoking status: Former    Types: Cigars    Quit date: 02/28/2011    Years since quitting: 11.9   Smokeless tobacco: Never  Vaping Use   Vaping status: Never Used  Substance Use Topics   Alcohol use: No   Drug use: Not Currently    Types: Marijuana     Allergies   Latex, Chloride, and Chlorine   Review of Systems Review of Systems Per HPI  Physical Exam Triage Vital Signs ED Triage Vitals [02/01/23 1028]  Encounter Vitals Group     BP 129/85     Systolic BP Percentile      Diastolic BP Percentile      Pulse Rate 73     Resp 16     Temp 97.8 F (36.6 C)     Temp Source Oral     SpO2 96 %     Weight      Height      Head Circumference      Peak Flow      Pain Score 6     Pain Loc  Pain Education      Exclude from Growth Chart    No data found.  Updated Vital Signs BP 129/85 (BP Location: Left Arm)   Pulse 73   Temp 97.8 F (36.6 C) (Oral)   Resp 16   LMP  (LMP Unknown)   SpO2 96%   Visual Acuity Right Eye Distance:   Left Eye Distance:   Bilateral Distance:    Right Eye Near:   Left Eye Near:    Bilateral Near:     Physical Exam Constitutional:      General: She is not in acute distress.    Appearance: Normal appearance. She is not toxic-appearing or diaphoretic.  HENT:     Head: Normocephalic and atraumatic.     Right Ear: Tympanic membrane and ear canal normal.     Left Ear: Tympanic membrane and ear canal normal.     Nose: Congestion present.     Mouth/Throat:     Mouth: Mucous membranes are moist.     Pharynx: Posterior oropharyngeal erythema present. No oropharyngeal exudate.     Tonsils: Tonsillar exudate present. No tonsillar abscesses. 1+ on the right. 1+ on the left.  Eyes:     Extraocular Movements: Extraocular  movements intact.     Conjunctiva/sclera: Conjunctivae normal.     Pupils: Pupils are equal, round, and reactive to light.  Cardiovascular:     Rate and Rhythm: Normal rate and regular rhythm.     Pulses: Normal pulses.     Heart sounds: Normal heart sounds.  Pulmonary:     Effort: Pulmonary effort is normal. No respiratory distress.     Breath sounds: Normal breath sounds. No wheezing.  Abdominal:     General: Abdomen is flat. Bowel sounds are normal.     Palpations: Abdomen is soft.  Musculoskeletal:        General: Normal range of motion.     Cervical back: Normal range of motion.  Skin:    General: Skin is warm and dry.  Neurological:     General: No focal deficit present.     Mental Status: She is alert and oriented to person, place, and time. Mental status is at baseline.  Psychiatric:        Mood and Affect: Mood normal.        Behavior: Behavior normal.      UC Treatments / Results  Labs (all labs ordered are listed, but only abnormal results are displayed) Labs Reviewed  CULTURE, GROUP A STREP [facility])  POCT RAPID STREP A (OFFICE)    EKG   Radiology No results found.  Procedures Procedures (including critical care time)  Medications Ordered in UC Medications - No data to display  Initial Impression / Assessment and Plan / UC Course  I have reviewed the triage vital signs and the nursing notes.  Pertinent labs & imaging results that were available during my care of the patient were reviewed by me and considered in my medical decision making (see chart for details).     Rapid strep is negative but I am still suspicious of strep given appearance of posterior pharynx on exam so will treat with amoxicillin antibiotic.  Throat culture is pending.  No signs of peritonsillar abscess on exam.  Discussed supportive care, symptom management, fluids, rest.  Advised strict follow-up precautions.  Patient verbalized understanding and was agreeable with plan. Final  Clinical Impressions(s) / UC Diagnoses   Final diagnoses:  Acute pharyngitis, unspecified etiology  Acute cough  Discharge Instructions      Your rapid strep is negative but given how your throat looks on exam, I am still going to treat you with antibiotic today.  Throat culture is pending.  Please follow-up if any symptoms persist or worsen.    ED Prescriptions     Medication Sig Dispense Auth. Provider   amoxicillin (AMOXIL) 500 MG capsule Take 1 capsule (500 mg total) by mouth 2 (two) times daily for 10 days. 20 capsule Gustavus Bryant, [place]      PDMP not reviewed this encounter.   Gustavus Bryant, [place] 02/01/23 1147

## 2023-02-04 LAB — CULTURE, GROUP A STREP (THRC)

## 2023-02-05 ENCOUNTER — Ambulatory Visit (HOSPITAL_COMMUNITY)
Admission: RE | Admit: 2023-02-05 | Discharge: 2023-02-05 | Disposition: A | Discharge status: Home / Self Care | Payer: BLUE CROSS/BLUE SHIELD | Source: Ambulatory Visit | Attending: Emergency Medicine

## 2023-02-05 ENCOUNTER — Encounter (HOSPITAL_COMMUNITY): Payer: Self-pay

## 2023-02-05 VITALS — BP 136/76 | HR 81 | Temp 98.4°F | Resp 18 | Ht 63.0 in

## 2023-02-05 DIAGNOSIS — J029 Acute pharyngitis, unspecified: Secondary | ICD-10-CM | POA: Diagnosis not present

## 2023-02-05 LAB — POCT MONO SCREEN (KUC): Mono, POC: NEGATIVE

## 2023-02-05 LAB — POCT RAPID STREP A (OFFICE): Rapid Strep A Screen: NEGATIVE

## 2023-02-05 MED ORDER — DEXAMETHASONE SODIUM PHOSPHATE 10 MG/ML IJ SOLN
INTRAMUSCULAR | Status: AC
  Filled 2023-02-05: qty 1

## 2023-02-05 MED ORDER — DEXAMETHASONE SODIUM PHOSPHATE 10 MG/ML IJ SOLN
10.0000 mg | Freq: Once | INTRAMUSCULAR | Status: AC
  Administered 2023-02-05: 10 mg via INTRAMUSCULAR

## 2023-02-05 NOTE — Discharge Instructions (Signed)
Your mono testing was negative.  Your rapid strep was also negative, however, I believe this may have been impacted by your recent amoxicillin use.  Please restart your amoxicillin.  We have given you a steroid injection in clinic to help with pain and inflammation.  You can also alternate between 800 mg of ibuprofen and 500 mg of Tylenol every 4-6 hours for any pain or swelling.  Warm saline gargles, tea with honey and popsicles or other cold foods can help with your symptomatic management of your sore throat.  We have obtained an oral swab and will contact you if anything results is abnormal.    Your symptoms should improve with the finishing of antibiotics, if no improvement or any changes return to clinic or follow-up with your primary care provider.

## 2023-02-05 NOTE — ED Provider Notes (Signed)
[facility]    CSN: [id number] Arrival date & time: 02/05/23  1342      History   Chief Complaint Chief Complaint  Patient presents with   Follow-up    Tested negative for strep but still have a cough, swelling in my throat and tonsil stones (white spot on my throat) - Entered by patient    HPI Angelica Griffin is a 27 y.o. female.   Patient presents to clinic for continued sore throat that has been ongoing for the past week.  She was seen here on December 5 and tested negative for strep.  She was placed on amoxicillin but discontinued this when her strep culture came back negative.  She does not think that the throat sample with a got last time was the best.  She has been taking Robitussin for her symptoms, reports she has finished a whole bottle of this.  She has not had any fevers that she knows of.  She was hot and cold 1 night.  She came in because her throat pain and swelling remain, endorses voice change.  She sees 1 large white patch to the back of her throat.  She has a nonproductive intermittent cough as well.  The history is provided by the patient and medical records.    Past Medical History:  Diagnosis Date   Asthma    GOITER NOS 04/26/2006   Qualifier: Diagnosis of  By: Bradly Bienenstock     Obesity     Patient Active Problem List   Diagnosis Date Noted   Syncope 08/16/2022   Bacterial vaginosis 11/20/2019   Vaginal discharge 04/17/2018   Infertility, female 02/18/2018   Screening for STD (sexually transmitted disease) 02/18/2018   Routine Papanicolaou smear 02/08/2018   Allergic rhinitis 07/11/2013   Asthma 10/30/2007   OBESITY, NOS 04/26/2006    History reviewed. No pertinent surgical history.  OB History   No obstetric history on file.      Home Medications    Prior to Admission medications   Medication Sig Start Date End Date Taking? Authorizing Provider  albuterol (VENTOLIN HFA) 108 (90 Base) MCG/ACT inhaler INHALE 2 PUFFS  INTO THE LUNGS EVERY 6 HOURS AS NEEDED FOR WHEEZE 10/02/22   Glendale Chard, DO  amoxicillin (AMOXIL) 500 MG capsule Take 1 capsule (500 mg total) by mouth 2 (two) times daily for 10 days. 02/01/23 02/11/23  Gustavus Bryant, FNP  metFORMIN (GLUCOPHAGE-XR) 500 MG 24 hr tablet TAKE 1 TABLET (500 MG TOTAL) BY MOUTH DAILY. 12/04/22   Glendale Chard, DO  metroNIDAZOLE (FLAGYL) 500 MG tablet Take 1 tablet (500 mg total) by mouth 2 (two) times daily. 10/25/22   Darral Dash, DO    Family History Family History  Problem Relation Age of Onset   Diabetes Maternal Grandmother    Hypertension Maternal Grandmother     Social History Social History   Tobacco Use   Smoking status: Former    Types: Cigars    Quit date: 02/28/2011    Years since quitting: 11.9   Smokeless tobacco: Never  Vaping Use   Vaping status: Never Used  Substance Use Topics   Alcohol use: No   Drug use: Not Currently    Types: Marijuana     Allergies   Latex, Chloride, and Chlorine   Review of Systems Review of Systems  Per HPI   Physical Exam Triage Vital Signs ED Triage Vitals  Encounter Vitals Group     BP 02/05/23 1419 136/76  Systolic BP Percentile --      Diastolic BP Percentile --      Pulse Rate 02/05/23 1419 81     Resp 02/05/23 1419 18     Temp 02/05/23 1419 98.4 F (36.9 C)     Temp Source 02/05/23 1419 Oral     SpO2 02/05/23 1419 96 %     Weight --      Height 02/05/23 1417 5\' 3"  (1.6 m)     Head Circumference --      Peak Flow --      Pain Score 02/05/23 1416 4     Pain Loc --      Pain Education --      Exclude from Growth Chart --    No data found.  Updated Vital Signs BP 136/76 (BP Location: Left Arm)   Pulse 81   Temp 98.4 F (36.9 C) (Oral)   Resp 18   Ht 5\' 3"  (1.6 m)   LMP 01/17/2023 (Exact Date)   SpO2 96%   BMI 38.69 kg/m   Visual Acuity Right Eye Distance:   Left Eye Distance:   Bilateral Distance:    Right Eye Near:   Left Eye Near:    Bilateral Near:      Physical Exam Vitals and nursing note reviewed.  Constitutional:      Appearance: Normal appearance.  HENT:     Head: Normocephalic and atraumatic.     Right Ear: External ear normal.     Left Ear: External ear normal.     Nose: Nose normal.     Mouth/Throat:     Mouth: Mucous membranes are moist.     Pharynx: Posterior oropharyngeal erythema present.     Tonsils: Tonsillar exudate present. 3+ on the right. 3+ on the left.  Eyes:     Conjunctiva/sclera: Conjunctivae normal.  Cardiovascular:     Rate and Rhythm: Normal rate and regular rhythm.     Heart sounds: Normal heart sounds. No murmur heard. Pulmonary:     Effort: Pulmonary effort is normal. No respiratory distress.     Breath sounds: Normal breath sounds.  Musculoskeletal:        General: Normal range of motion.     Cervical back: Normal range of motion.  Neurological:     General: No focal deficit present.     Mental Status: She is alert and oriented to person, place, and time.  Psychiatric:        Mood and Affect: Mood normal.        Behavior: Behavior normal. Behavior is cooperative.      UC Treatments / Results  Labs (all labs ordered are listed, but only abnormal results are displayed) Labs Reviewed  POCT RAPID STREP A (OFFICE)  POCT MONO SCREEN ([facility])  CYTOLOGY, (ORAL, ANAL, URETHRAL) ANCILLARY ONLY    EKG   Radiology No results found.  Procedures Procedures (including critical care time)  Medications Ordered in UC Medications  dexamethasone (DECADRON) injection 10 mg (has no administration in time range)    Initial Impression / Assessment and Plan / UC Course  I have reviewed the triage vital signs and the nursing notes.  Pertinent labs & imaging results that were available during my care of the patient were reviewed by me and considered in my medical decision making (see chart for details).  Vitals and triage reviewed, patient is hemodynamically stable.  Lungs are vesicular, heart with  regular rate and rhythm.  Tonsils with 3+  swelling and exudate, uvula midline.  Positive for voice change, negative for trismus.  Rapid strep negative, patient did take amoxicillin within the last 24 hours, feel as if this may interfere with the integrity of the rapid strep.  Mono testing negative.  Patient is sexually active, oral cytology swab obtained.  Encouraged to restart the amoxicillin and our staff will contact if additional treatment is needed.  IM steroid given in clinic for throat pain and swelling.  Plan of care, follow-up care return precautions given, no questions at this time.    Final Clinical Impressions(s) / UC Diagnoses   Final diagnoses:  Acute pharyngitis, unspecified etiology     Discharge Instructions      Your mono testing was negative.  Your rapid strep was also negative, however, I believe this may have been impacted by your recent amoxicillin use.  Please restart your amoxicillin.  We have given you a steroid injection in clinic to help with pain and inflammation.  You can also alternate between 800 mg of ibuprofen and 500 mg of Tylenol every 4-6 hours for any pain or swelling.  Warm saline gargles, tea with honey and popsicles or other cold foods can help with your symptomatic management of your sore throat.  We have obtained an oral swab and will contact you if anything results is abnormal.    Your symptoms should improve with the finishing of antibiotics, if no improvement or any changes return to clinic or follow-up with your primary care provider.      ED Prescriptions   None    PDMP not reviewed this encounter.   Rashana Andrew, [place], [place] 02/05/23 [phone]

## 2023-02-05 NOTE — ED Triage Notes (Signed)
Pt presents with cough and throat discomfort "with white spots" for approximately one week. Pt see here on 12/5 and was tested for strep, point of care testing was negative in addition to the culture sent off. Pt states she was taking the antibiotic prescribed but discontinued once the culture came back negative. Pt states she took Robitussin for symptoms with little relief. Pt denies fevers at home since being seen last. Pt currently rates her throat pain a 4/10.

## 2023-02-06 LAB — CYTOLOGY, (ORAL, ANAL, URETHRAL) ANCILLARY ONLY
Chlamydia: NEGATIVE
Comment: NEGATIVE
Comment: NORMAL
Neisseria Gonorrhea: NEGATIVE

## 2023-03-09 ENCOUNTER — Other Ambulatory Visit: Payer: Self-pay | Admitting: Student

## 2023-03-09 DIAGNOSIS — E282 Polycystic ovarian syndrome: Secondary | ICD-10-CM

## 2023-09-14 ENCOUNTER — Encounter: Payer: Self-pay | Admitting: Advanced Practice Midwife

## 2023-11-06 ENCOUNTER — Encounter: Admitting: Student

## 2023-11-19 ENCOUNTER — Encounter: Payer: Self-pay | Admitting: Student

## 2023-11-19 ENCOUNTER — Other Ambulatory Visit (HOSPITAL_COMMUNITY)
Admission: RE | Admit: 2023-11-19 | Discharge: 2023-11-19 | Disposition: A | Discharge status: Home / Self Care | Source: Ambulatory Visit | Attending: Family Medicine | Admitting: Family Medicine

## 2023-11-19 ENCOUNTER — Ambulatory Visit: Admitting: Student

## 2023-11-19 VITALS — BP 132/78 | HR 70 | Ht 63.0 in | Wt 232.2 lb

## 2023-11-19 DIAGNOSIS — Z113 Encounter for screening for infections with a predominantly sexual mode of transmission: Secondary | ICD-10-CM | POA: Diagnosis not present

## 2023-11-19 DIAGNOSIS — Z23 Encounter for immunization: Secondary | ICD-10-CM | POA: Diagnosis not present

## 2023-11-19 DIAGNOSIS — E66813 Obesity, class 3: Secondary | ICD-10-CM

## 2023-11-19 DIAGNOSIS — J4530 Mild persistent asthma, uncomplicated: Secondary | ICD-10-CM

## 2023-11-19 DIAGNOSIS — R739 Hyperglycemia, unspecified: Secondary | ICD-10-CM | POA: Diagnosis not present

## 2023-11-19 DIAGNOSIS — Z124 Encounter for screening for malignant neoplasm of cervix: Secondary | ICD-10-CM

## 2023-11-19 DIAGNOSIS — Z6841 Body Mass Index (BMI) 40.0 and over, adult: Secondary | ICD-10-CM

## 2023-11-19 DIAGNOSIS — E282 Polycystic ovarian syndrome: Secondary | ICD-10-CM

## 2023-11-19 DIAGNOSIS — Z0000 Encounter for general adult medical examination without abnormal findings: Secondary | ICD-10-CM | POA: Diagnosis not present

## 2023-11-19 MED ORDER — METFORMIN HCL ER 500 MG PO TB24: 500.0000 mg | ORAL_TABLET | Freq: Every day | ORAL | 0 refills | Status: AC

## 2023-11-19 NOTE — Progress Notes (Signed)
    SUBJECTIVE:   CHIEF COMPLAINT / HPI:   Angelica Griffin is a 28 y.o. female presenting for annual visit. She has been doing well, no concerns.   I reviewed previous lab results: Previous pap smear done one year ago did not have transformation zone. Repeat today.   PERTINENT  PMH / PSH: reviewed and updated.  OBJECTIVE:   BP 132/78   Pulse 70   Ht 5' 3 (1.6 m)   Wt 232 lb 3.2 oz (105.3 kg)   LMP 11/02/2023   SpO2 99%   BMI 41.13 kg/m   Well-appearing, no acute distress Cardio: Regular rate, regular rhythm, no murmurs on exam. Pulm: Clear, no wheezing, no crackles. No increased work of breathing Abdominal: bowel sounds present, soft, non-tender, non-distended Extremities: no peripheral edema  Neuro: alert and oriented x3, speech normal in content, no facial asymmetry, strength intact and equal bilaterally in UE and LE, pupils equal and reactive to light.  Psych:  Cognition and judgment appear intact. Alert, communicative  and cooperative with normal attention span and concentration. No apparent delusions, illusions, hallucinations    Pelvic Exam: MA chaperone present  Normal external genitalia No abnormal discharge  No cervical motion tenderness  Cervix visualized with no lesions    ASSESSMENT/PLAN:   Assessment & Plan Encounter for adult wellness visit Screening for cervical cancer Repeat Pap today due to absent transformation zone on last pap.  Screen for STD (sexually transmitted disease) HIV, RPR, GC and trich collected today  Hyperglycemia Class 3 severe obesity without serious comorbidity with body mass index (BMI) of 40.0 to 44.9 in adult, unspecified obesity type PCOS (polycystic ovarian syndrome) Check BMP and lipid panel today Continue Metformin  500 mg Mild persistent asthma without complication Well controlled on albuterol  only needing once every 2 weeks Can consider daily inhaler during winter season if patient begins needing albuterol  more  frequently    Flu shot given today   Damien Pinal, DO [facility]

## 2023-11-19 NOTE — Assessment & Plan Note (Addendum)
 Well controlled on albuterol  only needing once every 2 weeks Can consider daily inhaler during winter season if patient begins needing albuterol  more frequently

## 2023-11-19 NOTE — Patient Instructions (Signed)
 It was great to see you today!   I have ordered blood work today. I will send you a message through MyChart or send you a letter with your results. If there is an abnormal result, I will give you a call.    No future appointments.  Please arrive 15 minutes before your appointment to ensure smooth check in process.  If you are more than 15 minutes late, you may be asked to reschedule.   Please bring a list of your medications with you to all appointments.   Please call the clinic at [phone] if your symptoms worsen or you have any concerns.  Thank you for allowing me to participate in your care, Dr. Damien Pinal [facility]

## 2023-11-19 NOTE — Assessment & Plan Note (Signed)
 Check BMP and lipid panel today Continue Metformin  500 mg

## 2023-11-20 ENCOUNTER — Ambulatory Visit: Payer: Self-pay | Admitting: Student

## 2023-11-20 LAB — LIPID PANEL
Chol/HDL Ratio: 4.1 ratio (ref 0.0–4.4)
Cholesterol, Total: 146 mg/dL (ref 100–199)
HDL: 36 mg/dL — ABNORMAL LOW (ref >39)
LDL Chol Calc (NIH): 93 mg/dL (ref 0–99)
Triglycerides: 86 mg/dL (ref 0–149)
VLDL Cholesterol Cal: 17 mg/dL (ref 5–40)

## 2023-11-20 LAB — BASIC METABOLIC PANEL WITH GFR
BUN/Creatinine Ratio: 10 (ref 9–23)
BUN: 9 mg/dL (ref 6–20)
CO2: 24 mmol/L (ref 20–29)
Calcium: 9.4 mg/dL (ref 8.7–10.2)
Chloride: 105 mmol/L (ref 96–106)
Creatinine, Ser: 0.89 mg/dL (ref 0.57–1.00)
Glucose: 77 mg/dL (ref 70–99)
Potassium: 4.4 mmol/L (ref 3.5–5.2)
Sodium: 141 mmol/L (ref 134–144)
eGFR: 89 mL/min/1.73 (ref >59)

## 2023-11-20 LAB — RPR: RPR Ser Ql: NONREACTIVE

## 2023-11-20 LAB — HIV ANTIBODY (ROUTINE TESTING W REFLEX): HIV Screen 4th Generation wRfx: NONREACTIVE

## 2023-11-21 LAB — CYTOLOGY - PAP
Chlamydia: NEGATIVE
Comment: NEGATIVE
Comment: NEGATIVE
Comment: NEGATIVE
Comment: NORMAL
Diagnosis: NEGATIVE
High risk HPV: NEGATIVE
Neisseria Gonorrhea: NEGATIVE
Trichomonas: NEGATIVE

## 2024-02-15 ENCOUNTER — Other Ambulatory Visit: Payer: Self-pay | Admitting: Student

## 2024-02-15 DIAGNOSIS — E282 Polycystic ovarian syndrome: Secondary | ICD-10-CM
